# Patient Record
Sex: Male | Born: 1965 | Hispanic: Yes | Marital: Married | Smoking: Never smoker
Health system: Southern US, Community
[De-identification: ages and names within clinical notes are randomized; demographics above are authoritative.]

## PROBLEM LIST (undated history)

## (undated) DIAGNOSIS — I10 Essential (primary) hypertension: Secondary | ICD-10-CM

## (undated) DIAGNOSIS — F419 Anxiety disorder, unspecified: Secondary | ICD-10-CM

## (undated) DIAGNOSIS — K589 Irritable bowel syndrome without diarrhea: Secondary | ICD-10-CM

## (undated) DIAGNOSIS — E119 Type 2 diabetes mellitus without complications: Secondary | ICD-10-CM

## (undated) DIAGNOSIS — E785 Hyperlipidemia, unspecified: Secondary | ICD-10-CM

## (undated) DIAGNOSIS — H409 Unspecified glaucoma: Secondary | ICD-10-CM

## (undated) DIAGNOSIS — G4730 Sleep apnea, unspecified: Secondary | ICD-10-CM

## (undated) HISTORY — DX: Anxiety disorder, unspecified: F41.9

## (undated) HISTORY — DX: Hyperlipidemia, unspecified: E78.5

## (undated) HISTORY — PX: CHOLECYSTECTOMY: SHX55

## (undated) HISTORY — DX: Irritable bowel syndrome, unspecified: K58.9

## (undated) HISTORY — DX: Sleep apnea, unspecified: G47.30

## (undated) HISTORY — PX: APPENDECTOMY: SHX54

## (undated) HISTORY — DX: Unspecified glaucoma: H40.9

## (undated) HISTORY — DX: Type 2 diabetes mellitus without complications: E11.9

## (undated) HISTORY — DX: Essential (primary) hypertension: I10

---

## 2011-04-17 ENCOUNTER — Ambulatory Visit: Payer: Self-pay

## 2011-10-02 ENCOUNTER — Ambulatory Visit: Payer: Self-pay | Admitting: Gastroenterology

## 2011-10-27 ENCOUNTER — Ambulatory Visit: Payer: Self-pay | Admitting: Gastroenterology

## 2012-06-09 ENCOUNTER — Ambulatory Visit: Payer: Self-pay | Admitting: Internal Medicine

## 2013-01-27 ENCOUNTER — Ambulatory Visit: Payer: Self-pay

## 2014-10-22 ENCOUNTER — Ambulatory Visit: Payer: Self-pay | Admitting: Physician Assistant

## 2016-02-28 DIAGNOSIS — R079 Chest pain, unspecified: Secondary | ICD-10-CM | POA: Insufficient documentation

## 2017-09-08 ENCOUNTER — Other Ambulatory Visit: Payer: Self-pay

## 2017-09-08 MED ORDER — METFORMIN HCL 500 MG PO TABS: 500.0000 mg | ORAL_TABLET | Freq: Every day | ORAL | 3 refills | Status: DC

## 2017-12-21 ENCOUNTER — Other Ambulatory Visit: Payer: Self-pay | Admitting: Internal Medicine

## 2017-12-21 MED ORDER — METFORMIN HCL 500 MG PO TABS: 500.0000 mg | ORAL_TABLET | Freq: Every day | ORAL | 3 refills | Status: DC

## 2018-03-22 ENCOUNTER — Telehealth: Payer: Self-pay | Admitting: Internal Medicine

## 2018-03-22 ENCOUNTER — Other Ambulatory Visit: Payer: Self-pay | Admitting: Nurse Practitioner

## 2018-03-22 MED ORDER — METFORMIN HCL 500 MG PO TABS: 500.0000 mg | ORAL_TABLET | Freq: Every day | ORAL | 0 refills | Status: DC

## 2018-03-22 NOTE — Telephone Encounter (Signed)
Refilled medication for one month needs to have appt , left message

## 2018-04-01 ENCOUNTER — Ambulatory Visit: Payer: Self-pay | Admitting: Adult Health

## 2018-04-12 DIAGNOSIS — E119 Type 2 diabetes mellitus without complications: Secondary | ICD-10-CM | POA: Insufficient documentation

## 2018-06-16 ENCOUNTER — Telehealth: Payer: Self-pay

## 2018-06-16 NOTE — Telephone Encounter (Signed)
CALLED PT ADVISING TO REFILL MEDICATIONS PT NEEDED TO BE SEEN. SX APPT FOR 06-23-18

## 2018-06-23 ENCOUNTER — Ambulatory Visit: Payer: Self-pay | Admitting: Nurse Practitioner

## 2019-05-17 ENCOUNTER — Encounter: Payer: Self-pay | Admitting: Internal Medicine

## 2019-05-17 ENCOUNTER — Ambulatory Visit: Payer: BLUE CROSS/BLUE SHIELD | Admitting: Internal Medicine

## 2019-05-17 ENCOUNTER — Other Ambulatory Visit: Payer: Self-pay

## 2019-05-17 VITALS — BP 128/82 | HR 80 | Resp 16 | Ht 66.0 in | Wt 218.4 lb

## 2019-05-17 DIAGNOSIS — G4733 Obstructive sleep apnea (adult) (pediatric): Secondary | ICD-10-CM | POA: Diagnosis not present

## 2019-05-17 DIAGNOSIS — Z9989 Dependence on other enabling machines and devices: Secondary | ICD-10-CM

## 2019-05-17 DIAGNOSIS — E1159 Type 2 diabetes mellitus with other circulatory complications: Secondary | ICD-10-CM | POA: Insufficient documentation

## 2019-05-17 DIAGNOSIS — H409 Unspecified glaucoma: Secondary | ICD-10-CM | POA: Insufficient documentation

## 2019-05-17 DIAGNOSIS — I10 Essential (primary) hypertension: Secondary | ICD-10-CM | POA: Diagnosis not present

## 2019-05-17 DIAGNOSIS — K589 Irritable bowel syndrome without diarrhea: Secondary | ICD-10-CM | POA: Insufficient documentation

## 2019-05-17 DIAGNOSIS — G4730 Sleep apnea, unspecified: Secondary | ICD-10-CM | POA: Insufficient documentation

## 2019-05-17 DIAGNOSIS — F419 Anxiety disorder, unspecified: Secondary | ICD-10-CM | POA: Insufficient documentation

## 2019-05-17 NOTE — Progress Notes (Signed)
[facility] [address] [place], [place] [address]  Pulmonary Sleep Medicine   Office Visit Note  Patient Name: Andre Savage DOB: July 28, 1966 MRN [id number]  Date of Service: 05/17/2019  Complaints/HPI: PT is here after awhile, he has been using cpap for  Years.  He does  Not feel like his machine is working as well.  He reports feeling fatigued and tired.  His wife claims he is snoring at times even with his machine.  He reports feeling excessive daytime fatigue some afternoons.    ROS  General: (-) fever, (-) chills, (-) night sweats, (-) weakness Skin: (-) rashes, (-) itching,. Eyes: (-) visual changes, (-) redness, (-) itching. Nose and Sinuses: (-) nasal stuffiness or itchiness, (-) postnasal drip, (-) nosebleeds, (-) sinus trouble. Mouth and Throat: (-) sore throat, (-) hoarseness. Neck: (-) swollen glands, (-) enlarged thyroid, (-) neck pain. Respiratory: - cough, (-) bloody sputum, - shortness of breath, - wheezing. Cardiovascular: - ankle swelling, (-) chest pain. Lymphatic: (-) lymph node enlargement. Neurologic: (-) numbness, (-) tingling. Psychiatric: (-) anxiety, (-) depression   Current Medication: Outpatient Encounter Medications as of 05/17/2019  Medication Sig  . ALPRAZolam (XANAX) 0.25 MG tablet TAKE 1 TABLET BY MOUTH NIGHTLY AS NEEDED FOR SLEEP  . amLODipine (NORVASC) 5 MG tablet Take by mouth.  . colestipol (COLESTID) 1 g tablet Take by mouth.  . dorzolamide-timolol (COSOPT) 22.3-6.8 MG/ML ophthalmic solution Apply to eye.  . latanoprost (XALATAN) 0.005 % ophthalmic solution 1 drop at bedtime.  Marland Kitchen losartan-hydrochlorothiazide (HYZAAR) 100-12.5 MG tablet Take by mouth.  . metFORMIN (GLUCOPHAGE) 500 MG tablet Take 1 tablet (500 mg total) by mouth daily.  . Misc. Devices MISC by Does not apply route. cpap  . sildenafil (REVATIO) 20 MG tablet Take by mouth.   No facility-administered encounter medications on file as of 05/17/2019.     Surgical  History: Past Surgical History:  Procedure Laterality Date  . APPENDECTOMY    . CHOLECYSTECTOMY      Medical History: Past Medical History:  Diagnosis Date  . Anxiety   . Diabetes mellitus without complication (HCC)   . Glaucoma   . Hyperlipidemia   . Hypertension   . IBS (irritable bowel syndrome)   . Sleep apnea   . Sleep apnea     Family History: Family History  Problem Relation Age of Onset  . Hypertension Mother   . Cancer Maternal Grandfather     Social History: Social History   Socioeconomic History  . Marital status: Married    Spouse name: Not on file  . Number of children: Not on file  . Years of education: Not on file  . Highest education level: Not on file  Occupational History  . Not on file  Social Needs  . Financial resource strain: Not on file  . Food insecurity    Worry: Not on file    Inability: Not on file  . Transportation needs    Medical: Not on file    Non-medical: Not on file  Tobacco Use  . Smoking status: Never Smoker  . Smokeless tobacco: Never Used  Substance and Sexual Activity  . Alcohol use: Yes    Frequency: Never    Comment: social   . Drug use: Never  . Sexual activity: Not on file  Lifestyle  . Physical activity    Days per week: Not on file    Minutes per session: Not on file  . Stress: Not on file  Relationships  . Social Herbalist on phone: Not on file    Gets together: Not on file    Attends religious service: Not on file    Active member of club or organization: Not on file    Attends meetings of clubs or organizations: Not on file    Relationship status: Not on file  . Intimate partner violence    Fear of current or ex partner: Not on file    Emotionally abused: Not on file    Physically abused: Not on file    Forced sexual activity: Not on file  Other Topics Concern  . Not on file  Social History Narrative  . Not on file    Vital Signs: Blood pressure 128/82, pulse 80, resp. rate 16,  height 5\' 6"  (1.676 m), weight 218 lb 6.4 oz (99.1 kg), SpO2 97 %.  Examination: General Appearance: The patient is well-developed, well-nourished, and in no distress. Skin: Gross inspection of skin unremarkable. Head: normocephalic, no gross deformities. Eyes: no gross deformities noted. ENT: ears appear grossly normal no exudates. Neck: Supple. No thyromegaly. No LAD. Respiratory: clear bilaterally. Cardiovascular: Normal S1 and S2 without murmur or rub. Extremities: No cyanosis. pulses are equal. Neurologic: Alert and oriented. No involuntary movements.  LABS: No results found for this or any previous visit (from the past 2160 hour(s)).  Radiology: Dg Abd 1 View  Result Date: 10/22/2014 * PRIOR REPORT IMPORTED FROM AN EXTERNAL SYSTEM * CLINICAL DATA:  One week of right flank pain ; family history of kidney stones. EXAM: ABDOMEN - 1 VIEW COMPARISON:  None. FINDINGS: The bowel gas pattern is normal. There are no abnormal calcifications which project over the kidneys nor along the expected course of the ureters, or over the urinary bladder. The bony structures are unremarkable. There are surgical clips in the gallbladder fossa. IMPRESSION: No radiopaque urinary tract stones are demonstrated. Electronically Signed   By: David  Martinique   On: 10/22/2014 14:56     No results found.  No results found.    Assessment and Plan: Patient Active Problem List   Diagnosis Date Noted  . Anxiety 05/17/2019  . Glaucoma 05/17/2019  . Hypertension 05/17/2019  . IBS (irritable bowel syndrome) 05/17/2019  . Sleep apnea 05/17/2019  . Controlled type 2 diabetes mellitus without complication, without long-term current use of insulin ([place]) 04/12/2018  . Chest pain with low risk for cardiac etiology 02/28/2016   1. OSA on CPAP Sleep study ordered to await patient for new CPAP machine.  Patient will likely need titration study to determine appropriate pressure when that time comes. - [facility] SLEEP STUDY;  Future  2. Essential hypertension Stable, continue present therapy.   3. Morbid obesity ([facility]) Obesity Counseling: Risk Assessment: An assessment of behavioral risk factors was made today and includes lack of exercise sedentary lifestyle, lack of portion control and poor dietary habits.  Risk Modification Advice: She was counseled on portion control guidelines. Restricting daily caloric intake to. . The detrimental long term effects of obesity on her health and ongoing poor compliance was also discussed with the patient.     General Counseling: I have discussed the findings of the evaluation and examination with [facility].  I have also discussed any further diagnostic evaluation thatmay be needed or ordered today. Joseangel verbalizes understanding of the findings of todays visit. We also reviewed his medications today and discussed drug interactions and side effects including but not limited excessive drowsiness and altered mental states.  We also discussed that there is always a risk not just to him but also people around him. he has been encouraged to call the office with any questions or concerns that should arise related to todays visit.    Time spent: 15  I have personally obtained a history, examined the patient, evaluated laboratory and imaging results, formulated the assessment and plan and placed orders.    Yevonne PaxSaadat A Khan, MD [facility] Pulmonary and Critical Care Sleep medicine

## 2019-05-29 ENCOUNTER — Other Ambulatory Visit: Payer: BLUE CROSS/BLUE SHIELD | Admitting: Internal Medicine

## 2019-05-30 ENCOUNTER — Other Ambulatory Visit: Payer: BLUE CROSS/BLUE SHIELD | Admitting: Internal Medicine

## 2019-06-12 ENCOUNTER — Ambulatory Visit: Payer: BLUE CROSS/BLUE SHIELD | Admitting: Internal Medicine

## 2019-07-14 DIAGNOSIS — M4317 Spondylolisthesis, lumbosacral region: Secondary | ICD-10-CM | POA: Insufficient documentation

## 2019-07-14 DIAGNOSIS — M51379 Other intervertebral disc degeneration, lumbosacral region without mention of lumbar back pain or lower extremity pain: Secondary | ICD-10-CM | POA: Insufficient documentation

## 2019-09-14 ENCOUNTER — Other Ambulatory Visit: Payer: Self-pay | Admitting: Ophthalmology

## 2019-09-14 ENCOUNTER — Other Ambulatory Visit: Payer: Self-pay | Admitting: Internal Medicine

## 2019-09-14 DIAGNOSIS — H5347 Heteronymous bilateral field defects: Secondary | ICD-10-CM

## 2019-09-20 ENCOUNTER — Ambulatory Visit
Admission: RE | Admit: 2019-09-20 | Discharge: 2019-09-20 | Disposition: A | Discharge status: Home / Self Care | Payer: BLUE CROSS/BLUE SHIELD | Source: Ambulatory Visit | Attending: Ophthalmology | Admitting: Ophthalmology

## 2019-09-20 ENCOUNTER — Other Ambulatory Visit: Payer: Self-pay

## 2019-09-20 DIAGNOSIS — H5347 Heteronymous bilateral field defects: Secondary | ICD-10-CM | POA: Insufficient documentation

## 2019-09-20 LAB — POCT I-STAT CREATININE: Creatinine, Ser: 1.1 mg/dL (ref 0.61–1.24)

## 2019-09-20 MED ORDER — IOHEXOL 300 MG/ML  SOLN
75.0000 mL | Freq: Once | INTRAMUSCULAR | Status: AC | PRN
  Administered 2019-09-20: 75 mL via INTRAVENOUS

## 2019-11-01 DIAGNOSIS — D352 Benign neoplasm of pituitary gland: Secondary | ICD-10-CM | POA: Insufficient documentation

## 2019-12-02 ENCOUNTER — Ambulatory Visit: Payer: BLUE CROSS/BLUE SHIELD | Attending: Internal Medicine

## 2019-12-02 DIAGNOSIS — Z23 Encounter for immunization: Secondary | ICD-10-CM

## 2019-12-02 NOTE — Progress Notes (Signed)
   Covid-19 Vaccination Clinic  Name:  Andre Savage    MRN: [id number] DOB: April 11, 1966  12/02/2019  Mr. Andre Savage was observed post Covid-19 immunization for 15 minutes without incident. He was provided with Vaccine Information Sheet and instruction to access the V-Safe system.   Mr. Andre Savage was instructed to call 911 with any severe reactions post vaccine: Marland Kitchen Difficulty breathing  . Swelling of face and throat  . A fast heartbeat  . A bad rash all over body  . Dizziness and weakness   Immunizations Administered    Name Date Dose VIS Date Route   Pfizer COVID-19 Vaccine 12/02/2019  3:48 PM 0.3 mL 08/18/2019 Intramuscular   Manufacturer: [employer], [employer]   Lot: [id number]   NDC: [address]-0

## 2019-12-27 ENCOUNTER — Ambulatory Visit: Payer: BLUE CROSS/BLUE SHIELD | Attending: Internal Medicine

## 2019-12-27 DIAGNOSIS — Z23 Encounter for immunization: Secondary | ICD-10-CM

## 2019-12-27 NOTE — Progress Notes (Signed)
   Covid-19 Vaccination Clinic  Name:  Andre Savage    MRN: [id number] DOB: [birth year]  12/27/2019  Mr. Andre Savage was observed post Covid-19 immunization for 15 minutes without incident. He was provided with Vaccine Information Sheet and instruction to access the V-Safe system.   Mr. Andre Savage was instructed to call 911 with any severe reactions post vaccine: Marland Kitchen Difficulty breathing  . Swelling of face and throat  . A fast heartbeat  . A bad rash all over body  . Dizziness and weakness   Immunizations Administered    Name Date Dose VIS Date Route   Pfizer COVID-19 Vaccine 12/27/2019  4:06 PM 0.3 mL 11/01/2018 Intramuscular   Manufacturer: [employer], [employer]   Lot: [id number]   NDC: [address]-6

## 2020-04-13 ENCOUNTER — Emergency Department
Admission: EM | Admit: 2020-04-13 | Discharge: 2020-04-13 | Disposition: A | Discharge status: Home / Self Care | Payer: BLUE CROSS/BLUE SHIELD | Attending: Emergency Medicine | Admitting: Emergency Medicine

## 2020-04-13 ENCOUNTER — Other Ambulatory Visit: Payer: Self-pay

## 2020-04-13 DIAGNOSIS — R1013 Epigastric pain: Secondary | ICD-10-CM | POA: Diagnosis not present

## 2020-04-13 DIAGNOSIS — R1110 Vomiting, unspecified: Secondary | ICD-10-CM | POA: Insufficient documentation

## 2020-04-13 DIAGNOSIS — R079 Chest pain, unspecified: Secondary | ICD-10-CM | POA: Insufficient documentation

## 2020-04-13 DIAGNOSIS — Z5321 Procedure and treatment not carried out due to patient leaving prior to being seen by health care provider: Secondary | ICD-10-CM | POA: Diagnosis not present

## 2020-04-13 LAB — COMPREHENSIVE METABOLIC PANEL
ALT: 25 U/L (ref 0–44)
AST: 24 U/L (ref 15–41)
Albumin: 4.5 g/dL (ref 3.5–5.0)
Alkaline Phosphatase: 111 U/L (ref 38–126)
Anion gap: 12 (ref 5–15)
BUN: 15 mg/dL (ref 6–20)
CO2: 23 mmol/L (ref 22–32)
Calcium: 9.1 mg/dL (ref 8.9–10.3)
Chloride: 100 mmol/L (ref 98–111)
Creatinine, Ser: 0.77 mg/dL (ref 0.61–1.24)
GFR calc Af Amer: >60 mL/min (ref >60)
GFR calc non Af Amer: >60 mL/min (ref >60)
Glucose, Bld: 161 mg/dL — ABNORMAL HIGH (ref 70–99)
Potassium: 4.1 mmol/L (ref 3.5–5.1)
Sodium: 135 mmol/L (ref 135–145)
Total Bilirubin: 0.9 mg/dL (ref 0.3–1.2)
Total Protein: 7.6 g/dL (ref 6.5–8.1)

## 2020-04-13 LAB — URINALYSIS, COMPLETE (UACMP) WITH MICROSCOPIC
Bilirubin Urine: NEGATIVE
Glucose, UA: NEGATIVE mg/dL
Hgb urine dipstick: NEGATIVE
Ketones, ur: NEGATIVE mg/dL
Leukocytes,Ua: NEGATIVE
Nitrite: NEGATIVE
Protein, ur: 100 mg/dL — AB
Specific Gravity, Urine: 1.025 (ref 1.005–1.030)
Squamous Epithelial / HPF: NONE SEEN (ref 0–5)
pH: 5 (ref 5.0–8.0)

## 2020-04-13 LAB — LIPASE, BLOOD: Lipase: 34 U/L (ref 11–51)

## 2020-04-13 LAB — CBC
HCT: 41.1 % (ref 39.0–52.0)
Hemoglobin: 13.3 g/dL (ref 13.0–17.0)
MCH: 25.7 pg — ABNORMAL LOW (ref 26.0–34.0)
MCHC: 32.4 g/dL (ref 30.0–36.0)
MCV: 79.5 fL — ABNORMAL LOW (ref 80.0–100.0)
Platelets: 248 10*3/uL (ref 150–400)
RBC: 5.17 MIL/uL (ref 4.22–5.81)
RDW: 18.3 % — ABNORMAL HIGH (ref 11.5–15.5)
WBC: 13.2 10*3/uL — ABNORMAL HIGH (ref 4.0–10.5)
nRBC: 0 % (ref 0.0–0.2)

## 2020-04-13 NOTE — ED Notes (Signed)
Pt called for room, pt no answer x 2.

## 2020-04-13 NOTE — ED Notes (Signed)
Repeat VS obtained by this RN at this time. Pt visualized resting on bench at this time with eyes closed.

## 2020-04-13 NOTE — ED Notes (Signed)
Pt called for room, pt with no answer x 3.

## 2020-04-13 NOTE — ED Triage Notes (Signed)
Pt states at 2am he started having epigastric/chest pain with N&V. States fatigue and SOB as well. States feels better since vomiting. A&O. No distress noted.

## 2020-04-13 NOTE — ED Triage Notes (Signed)
First RN Note: pt presents to ED via POV with c/o epigastric pain that radiates up into his chest, per EMS pt relieved with emesis, vomited approx 500cc's with EMS. Upon EMS arrival pain 8/10, now 3/10 after emesis.   125/60

## 2020-04-13 NOTE — ED Notes (Signed)
Pt called for repeat VS x 1, pt not visualized in ED.

## 2021-02-20 DIAGNOSIS — E230 Hypopituitarism: Secondary | ICD-10-CM | POA: Insufficient documentation

## 2021-10-23 DIAGNOSIS — D352 Benign neoplasm of pituitary gland: Secondary | ICD-10-CM | POA: Diagnosis not present

## 2021-10-23 DIAGNOSIS — R69 Illness, unspecified: Secondary | ICD-10-CM | POA: Diagnosis not present

## 2021-10-23 DIAGNOSIS — I10 Essential (primary) hypertension: Secondary | ICD-10-CM | POA: Diagnosis not present

## 2021-10-23 DIAGNOSIS — E119 Type 2 diabetes mellitus without complications: Secondary | ICD-10-CM | POA: Diagnosis not present

## 2021-11-13 DIAGNOSIS — H401132 Primary open-angle glaucoma, bilateral, moderate stage: Secondary | ICD-10-CM | POA: Diagnosis not present

## 2021-12-16 DIAGNOSIS — E669 Obesity, unspecified: Secondary | ICD-10-CM | POA: Diagnosis not present

## 2021-12-16 DIAGNOSIS — E119 Type 2 diabetes mellitus without complications: Secondary | ICD-10-CM | POA: Diagnosis not present

## 2021-12-16 DIAGNOSIS — D352 Benign neoplasm of pituitary gland: Secondary | ICD-10-CM | POA: Diagnosis not present

## 2022-01-10 ENCOUNTER — Other Ambulatory Visit: Payer: Self-pay

## 2022-01-10 ENCOUNTER — Emergency Department
Admission: EM | Admit: 2022-01-10 | Discharge: 2022-01-10 | Disposition: A | Discharge status: Home / Self Care | Payer: 59 | Attending: Emergency Medicine | Admitting: Emergency Medicine

## 2022-01-10 ENCOUNTER — Emergency Department: Payer: 59

## 2022-01-10 DIAGNOSIS — N200 Calculus of kidney: Secondary | ICD-10-CM

## 2022-01-10 DIAGNOSIS — N132 Hydronephrosis with renal and ureteral calculous obstruction: Secondary | ICD-10-CM | POA: Diagnosis not present

## 2022-01-10 DIAGNOSIS — E119 Type 2 diabetes mellitus without complications: Secondary | ICD-10-CM | POA: Diagnosis not present

## 2022-01-10 DIAGNOSIS — I10 Essential (primary) hypertension: Secondary | ICD-10-CM | POA: Insufficient documentation

## 2022-01-10 DIAGNOSIS — M4317 Spondylolisthesis, lumbosacral region: Secondary | ICD-10-CM | POA: Diagnosis not present

## 2022-01-10 DIAGNOSIS — Z9049 Acquired absence of other specified parts of digestive tract: Secondary | ICD-10-CM | POA: Diagnosis not present

## 2022-01-10 DIAGNOSIS — I700 Atherosclerosis of aorta: Secondary | ICD-10-CM | POA: Diagnosis not present

## 2022-01-10 DIAGNOSIS — R1031 Right lower quadrant pain: Secondary | ICD-10-CM | POA: Diagnosis not present

## 2022-01-10 LAB — URINALYSIS, ROUTINE W REFLEX MICROSCOPIC
Bilirubin Urine: NEGATIVE
Glucose, UA: NEGATIVE mg/dL
Ketones, ur: NEGATIVE mg/dL
Leukocytes,Ua: NEGATIVE
Nitrite: NEGATIVE
Protein, ur: NEGATIVE mg/dL
RBC / HPF: >50 RBC/hpf — ABNORMAL HIGH (ref 0–5)
Specific Gravity, Urine: 1.008 (ref 1.005–1.030)
Squamous Epithelial / HPF: NONE SEEN (ref 0–5)
pH: 7 (ref 5.0–8.0)

## 2022-01-10 LAB — BASIC METABOLIC PANEL
Anion gap: 9 (ref 5–15)
BUN: 13 mg/dL (ref 6–20)
CO2: 29 mmol/L (ref 22–32)
Calcium: 9.4 mg/dL (ref 8.9–10.3)
Chloride: 98 mmol/L (ref 98–111)
Creatinine, Ser: 0.89 mg/dL (ref 0.61–1.24)
GFR, Estimated: >60 mL/min (ref >60)
Glucose, Bld: 175 mg/dL — ABNORMAL HIGH (ref 70–99)
Potassium: 3.9 mmol/L (ref 3.5–5.1)
Sodium: 136 mmol/L (ref 135–145)

## 2022-01-10 LAB — CBC
HCT: 43.6 % (ref 39.0–52.0)
Hemoglobin: 14.2 g/dL (ref 13.0–17.0)
MCH: 28.3 pg (ref 26.0–34.0)
MCHC: 32.6 g/dL (ref 30.0–36.0)
MCV: 87 fL (ref 80.0–100.0)
Platelets: 243 10*3/uL (ref 150–400)
RBC: 5.01 MIL/uL (ref 4.22–5.81)
RDW: 14.4 % (ref 11.5–15.5)
WBC: 7.2 10*3/uL (ref 4.0–10.5)
nRBC: 0 % (ref 0.0–0.2)

## 2022-01-10 MED ORDER — MORPHINE SULFATE (PF) 4 MG/ML IV SOLN
INTRAVENOUS | Status: AC
  Filled 2022-01-10: qty 1

## 2022-01-10 MED ORDER — TAMSULOSIN HCL 0.4 MG PO CAPS
0.4000 mg | ORAL_CAPSULE | Freq: Every day | ORAL | 0 refills | Status: DC
Start: 2022-01-10 — End: 2022-01-15

## 2022-01-10 MED ORDER — KETOROLAC TROMETHAMINE 30 MG/ML IJ SOLN
30.0000 mg | Freq: Once | INTRAMUSCULAR | Status: AC
  Administered 2022-01-10: 30 mg via INTRAVENOUS

## 2022-01-10 MED ORDER — ONDANSETRON HCL 4 MG/2ML IJ SOLN
INTRAMUSCULAR | Status: AC
  Filled 2022-01-10: qty 2

## 2022-01-10 MED ORDER — MORPHINE SULFATE (PF) 4 MG/ML IV SOLN
4.0000 mg | Freq: Once | INTRAVENOUS | Status: AC
  Administered 2022-01-10: 4 mg via INTRAVENOUS

## 2022-01-10 MED ORDER — OXYCODONE-ACETAMINOPHEN 5-325 MG PO TABS
1.0000 | ORAL_TABLET | ORAL | 0 refills | Status: AC | PRN
Start: 2022-01-10 — End: 2023-01-10

## 2022-01-10 MED ORDER — CEPHALEXIN 500 MG PO CAPS: 500.0000 mg | ORAL_CAPSULE | Freq: Two times a day (BID) | ORAL | 0 refills | Status: DC

## 2022-01-10 MED ORDER — KETOROLAC TROMETHAMINE 30 MG/ML IJ SOLN
INTRAMUSCULAR | Status: AC
  Filled 2022-01-10: qty 1

## 2022-01-10 MED ORDER — ONDANSETRON HCL 4 MG/2ML IJ SOLN
4.0000 mg | Freq: Once | INTRAMUSCULAR | Status: AC
  Administered 2022-01-10: 4 mg via INTRAVENOUS

## 2022-01-10 MED ORDER — ONDANSETRON 4 MG PO TBDP: 4.0000 mg | ORAL_TABLET | Freq: Three times a day (TID) | ORAL | 0 refills | Status: DC | PRN

## 2022-01-10 MED ORDER — SODIUM CHLORIDE 0.9 % IV BOLUS
500.0000 mL | Freq: Once | INTRAVENOUS | Status: AC
Start: 2022-01-10 — End: 2022-01-10
  Administered 2022-01-10: 500 mL via INTRAVENOUS

## 2022-01-10 MED ORDER — NAPROXEN 500 MG PO TABS: 500.0000 mg | ORAL_TABLET | Freq: Two times a day (BID) | ORAL | 2 refills | Status: DC

## 2022-01-10 NOTE — ED Notes (Signed)
Pt verbalizes understanding of d/c instructions and when to return to ED. Steady gait, NAD noted.  ?

## 2022-01-10 NOTE — ED Notes (Signed)
See triage note. Pt reports waking up a few hours ago with right sided flank pain and nausea. Denies hematuria or dysuria. Appears uncomfortable.  ?

## 2022-01-10 NOTE — ED Provider Notes (Signed)
? ?[facility] ?Provider Note ? ? ? Event Date/Time  ? First MD Initiated Contact with Patient 01/10/22 0730   ?  (approximate) ? ? ?History  ? ?Flank Pain and Dysuria ? ? ?HPI ? ?Andre Savage is a 56 y.o. male with a history of diabetes, hypertension, appendectomy, cholecystectomy who presents with right-sided flank pain with radiation to his right groin.  He denies hematuria.  He reports the pain started at 4 AM this morning.  Felt well yesterday.  No history of kidney stones.  Denies dysuria to me.  No fevers.  1 episode of vomiting ?  ? ? ?Physical Exam  ? ?Triage Vital Signs: ?ED Triage Vitals  ?Enc Vitals Group  ?   BP 01/10/22 0614 (!) 158/91  ?   Pulse Rate 01/10/22 0614 66  ?   Resp 01/10/22 0614 17  ?   Temp 01/10/22 0614 98 ?F (36.7 ?C)  ?   Temp Source 01/10/22 0614 Oral  ?   SpO2 01/10/22 0614 97 %  ?   Weight --   ?   Height --   ?   Head Circumference --   ?   Peak Flow --   ?   Pain Score 01/10/22 0612 10  ?   Pain Loc --   ?   Pain Edu? --   ?   Excl. in GC? --   ? ? ?Most recent vital signs: ?Vitals:  ? 01/10/22 0844 01/10/22 0845  ?BP:    ?Pulse:  61  ?Resp:    ?Temp: 98.2 ?F (36.8 ?C)   ?SpO2:  96%  ? ? ? ?General: Awake, no distress.  ?CV:  Good peripheral perfusion.  ?Resp:  Normal effort.  ?Abd:  No distention.  Soft, nontender ?Other:  Mild right CVA tenderness ? ? ?ED Results / Procedures / Treatments  ? ?Labs ?(all labs ordered are listed, but only abnormal results are displayed) ?Labs Reviewed  ?URINALYSIS, ROUTINE W REFLEX MICROSCOPIC - Abnormal; Notable for the following components:  ?    Result Value  ? Color, Urine COLORLESS (*)   ? APPearance CLEAR (*)   ? Hgb urine dipstick MODERATE (*)   ? RBC / HPF >50 (*)   ? Bacteria, UA RARE (*)   ? All other components within normal limits  ?BASIC METABOLIC PANEL - Abnormal; Notable for the following components:  ? Glucose, Bld 175 (*)   ? All other components within normal limits  ?URINE CULTURE  ?CBC   ? ? ? ?EKG ? ? ? ? ?RADIOLOGY ?CT renal stone study interpreted by me, 5 mm stone noted ? ? ? ?PROCEDURES: ? ?Critical Care performed:  ? ?Procedures ? ? ?MEDICATIONS ORDERED IN ED: ?Medications  ?ketorolac (TORADOL) 30 MG/ML injection 30 mg ( Intravenous Not Given 01/10/22 0818)  ?ondansetron [facility]) injection 4 mg ( Intravenous Not Given 01/10/22 0819)  ?sodium chloride 0.9 % bolus 500 mL (0 mLs Intravenous Stopped 01/10/22 0834)  ?morphine (PF) 4 MG/ML injection 4 mg ( Intravenous Not Given 01/10/22 0819)  ? ? ? ?IMPRESSION / MDM / ASSESSMENT AND PLAN / ED COURSE  ?I reviewed the triage vital signs and the nursing notes. ? ? ? ?Patient presents with new acute abdominal pain that started at 4 AM this morning.  1 episode of vomiting. ? ?Differential includes ureterolithiasis, UTI, colitis ? ?We will treat the patient with IV Toradol, IV Zofran, IV fluids, obtain CT renal stone study ? ?Lab  work reviewed, normal CBCs, normal BMP, small amount of hemoglobin urinalysis ? ?CT scan demonstrates a 5 mm proximal ureteral stone, patient has never smoked. ? ?Pain relieved after IV morphine given ? ?Discussed with him the need for outpatient follow-up with urology.  Patient is a diabetic, no evidence of UTI however will cover with antibiotics, he knows to return immediately if any fevers. ? ? ? ? ? ?  ? ? ?FINAL CLINICAL IMPRESSION(S) / ED DIAGNOSES  ? ?Final diagnoses:  ?Kidney stone  ? ? ? ?Rx / DC Orders  ? ?ED Discharge Orders   ? ?      Ordered  ?  naproxen (NAPROSYN) 500 MG tablet  2 times daily with meals       ? 01/10/22 0836  ?  oxyCODONE-acetaminophen (PERCOCET) 5-325 MG tablet  Every 4 hours PRN       ? 01/10/22 0836  ?  tamsulosin (FLOMAX) 0.4 MG CAPS capsule  Daily       ? 01/10/22 0836  ?  ondansetron (ZOFRAN-ODT) 4 MG disintegrating tablet  Every 8 hours PRN       ? 01/10/22 0836  ?  cephALEXin (KEFLEX) 500 MG capsule  2 times daily       ? 01/10/22 0838  ? ?  ?  ? ?  ? ? ? ?Note:  This document was prepared using  Dragon voice recognition software and may include unintentional dictation errors. ?  ?Jene Every, MD ?01/10/22 [phone] ? ?

## 2022-01-10 NOTE — ED Notes (Signed)
Pt to CT

## 2022-01-10 NOTE — ED Notes (Signed)
Pt noted to have one episode of emesis.

## 2022-01-10 NOTE — ED Triage Notes (Signed)
Pt states he was awoken with right sided flank pain this morning around 3 hours ago. Endorses pain with urination. Pt is A&O times 4 and vitals WNL.  ?

## 2022-01-10 NOTE — Discharge Instructions (Addendum)
If you develop a fever you need to return to the emergency department immediately. ?

## 2022-01-11 LAB — URINE CULTURE: Culture: NO GROWTH

## 2022-01-13 ENCOUNTER — Encounter: Payer: Self-pay | Admitting: Urology

## 2022-01-13 ENCOUNTER — Ambulatory Visit: Payer: 59 | Admitting: Urology

## 2022-01-13 ENCOUNTER — Ambulatory Visit
Admission: RE | Admit: 2022-01-13 | Discharge: 2022-01-13 | Disposition: A | Discharge status: Home / Self Care | Payer: 59 | Source: Ambulatory Visit | Attending: Urology | Admitting: Urology

## 2022-01-13 ENCOUNTER — Other Ambulatory Visit: Payer: Self-pay | Admitting: *Deleted

## 2022-01-13 ENCOUNTER — Ambulatory Visit
Admission: RE | Admit: 2022-01-13 | Discharge: 2022-01-13 | Disposition: A | Discharge status: Home / Self Care | Payer: 59 | Attending: Urology | Admitting: Urology

## 2022-01-13 ENCOUNTER — Other Ambulatory Visit: Payer: Self-pay | Admitting: Urology

## 2022-01-13 VITALS — BP 122/78 | HR 73 | Ht 66.0 in | Wt 220.0 lb

## 2022-01-13 DIAGNOSIS — N201 Calculus of ureter: Secondary | ICD-10-CM | POA: Diagnosis not present

## 2022-01-13 DIAGNOSIS — N200 Calculus of kidney: Secondary | ICD-10-CM

## 2022-01-13 DIAGNOSIS — R109 Unspecified abdominal pain: Secondary | ICD-10-CM | POA: Diagnosis not present

## 2022-01-13 NOTE — Progress Notes (Signed)
? ?  01/13/22 ?4:24 PM  ? ?Andre Savage ?04-Jul-1966 ?[id number] ? ?CC: Right proximal ureteral stone ? ?HPI: ?56 year old male who presented to the ER on 01/10/2022 with severe right-sided flank pain.  Evaluation with CT showed a 5 mm right proximal ureteral stone, and he was discharged with medical expulsive therapy.  He has done well over the last 24 hours, but has had some intermittent right-sided flank pain over the last week ? ? ?PMH: ?Past Medical History:  ?Diagnosis Date  ? Anxiety   ? Diabetes mellitus without complication (HCC)   ? Glaucoma   ? Hyperlipidemia   ? Hypertension   ? IBS (irritable bowel syndrome)   ? Sleep apnea   ? Sleep apnea   ? ? ?Surgical History: ?Past Surgical History:  ?Procedure Laterality Date  ? APPENDECTOMY    ? CHOLECYSTECTOMY    ? ? ?Family History: ?Family History  ?Problem Relation Age of Onset  ? Hypertension Mother   ? Cancer Maternal Grandfather   ? ? ?Social History:  reports that he has never smoked. He has never been exposed to tobacco smoke. He has never used smokeless tobacco. He reports current alcohol use. He reports that he does not use drugs. ? ?Physical Exam: ?BP 122/78   Pulse 73   Ht 5\' 6"  (1.676 m)   Wt 220 lb (99.8 kg)   BMI 35.51 kg/m?   ? ?Constitutional:  Alert and oriented, No acute distress. ?Cardiovascular: No clubbing, cyanosis, or edema. ?Respiratory: Normal respiratory effort, no increased work of breathing. ?GI: Abdomen is soft, nontender, nondistended, no abdominal masses ? ? ?Laboratory Data: ?Reviewed in epic ? ?Pertinent Imaging: ?I have personally viewed and interpreted the CT showing a 5 mm right proximal ureteral stone, 900HU, 14cm SSD.  KUB today stone clearly seen in proximal ureter with no change in location. ? ?Assessment & Plan:   ?56 year old male with 5 mm right proximal ureteral stone, no progression over the last few days with ongoing right-sided flank pain, no clinical or laboratory evidence of infection. ? ?We discussed  various treatment options for urolithiasis including observation with or without medical expulsive therapy, shockwave lithotripsy (SWL), ureteroscopy and laser lithotripsy with stent placement, and percutaneous nephrolithotomy. ? ?We discussed that management is based on stone size, location, density, patient co-morbidities, and patient preference.  ? ?Stones <17mm in size have a >80% spontaneous passage rate. Data surrounding the use of tamsulosin for medical expulsive therapy is controversial, but meta analyses suggests it is most efficacious for distal stones between 5-47mm in size. Possible side effects include dizziness/lightheadedness, and retrograde ejaculation. ? ?SWL has a lower stone free rate in a single procedure, but also a lower complication rate compared to ureteroscopy and avoids a stent and associated stent related symptoms. Possible complications include renal hematoma, steinstrasse, and need for additional treatment. ? ?Ureteroscopy with laser lithotripsy and stent placement has a higher stone free rate than SWL in a single procedure, however increased complication rate including possible infection, ureteral injury, bleeding, and stent related morbidity. Common stent related symptoms include dysuria, urgency/frequency, and flank pain. ? ?After an extensive discussion of the risks and benefits of the above treatment options, the patient would like to proceed with right shockwave lithotripsy this week. ? ? ?9m, MD ?01/13/2022 ? ?[facility] ?[address], [address] ?[place], [place] ?[phone] ? ? ?

## 2022-01-13 NOTE — Patient Instructions (Signed)
Litotricia ?Lithotripsy ? ?La litotricia es un tratamiento que puede ayudar a romper los c?lculos renales que son demasiado grandes para eliminarse por s? mismos. Este es un procedimiento no quir?rgico que tritura los c?lculos renales con ondas de choque. Estas ondas de choque atraviesan el cuerpo y se concentran en los c?lculos renales. Estas hacen que los c?lculos renales se rompan en pedazos peque?os mientras a?n est?n en las v?as urinarias. Los pedazos m?s peque?os de los c?lculos pueden eliminarse con m?s facilidad del cuerpo a trav?s de la orina. ?Informe al m?dico acerca de lo siguiente: ?Cualquier alergia que tenga. ?Todos los [employer] [place], incluidos vitaminas, hierbas, gotas oft?lmicas, cremas y [address] de [address]. ?Problemas previos que usted o alg?n miembro de su familia hayan tenido con los anest?sicos. ?Cualquier trastorno de [employer]. ?Cirug?as a las que se haya sometido. ?Cualquier afecci?n m?dica que tenga. ?Si est? embarazada o podr?a estarlo. ??Cu?les son los riesgos? ?En general, se trata de un procedimiento seguro. Sin embargo, pueden ocurrir complicaciones, por ejemplo: ?Infecci?n. ?Sangrado del ri??n. ?Moretones en el ri??n o en la piel. ?Formaci?n de cicatrices en el ri??n que puede producir: ?Aumento de la presi?n arterial. ?Insuficiencia renal. ?Que los c?lculos renales reaparezcan (recurrencia). ?Da?o a otras estructuras u ?rganos, por ejemplo, el h?gado, el colon, el bazo o el p?ncreas. ?Bloqueo (obstrucci?n) del conducto que transporta la orina desde el ri??n a la vejiga (ur?ter). ?Imposibilidad de que el c?lculo renal se rompa en pedazos (fragmentos). ??Qu? ocurre antes del procedimiento? ?Mantenerse hidratado ?Siga las instrucciones del m?dico acerca de mantenerse hidratado, las cuales pueden incluir lo siguiente: ?[address] horas antes del procedimiento, puede beber l?quidos transparentes, como agua, jugos de fruta sin pulpa, caf? negro y t?  solo. ?Restricciones en las comidas y bebidas ?Siga las instrucciones del m?dico respecto de las restricciones de comidas o bebidas, las cuales pueden incluir lo siguiente: ?Ocho horas antes del procedimiento, deje de ingerir comidas o alimentos pesados, como carne, alimentos fritos o alimentos grasos. ?Seis horas antes del procedimiento, deje de ingerir comidas o alimentos livianos, como tostadas o cereales. ?Seis horas antes del procedimiento, deje de beber [place] o bebidas que [employer]. ?Dos horas antes del procedimiento, deje de beber l?quidos transparentes. ?Medicamentos ?Consulte al m?dico si debe hacer o no lo siguiente: ?Cambiar o suspender los medicamentos que [place] habitualmente. Esto es muy importante si toma medicamentos para la diabetes o anticoagulantes. ?Tomar medicamentos como aspirina e ibuprofeno. Estos medicamentos pueden tener un efecto anticoagulante en la [place]. No tome estos medicamentos a menos que el m?dico se lo indique. ?Usar medicamentos de venta libre, vitaminas, hierbas y suplementos. ?Estudios ?Pueden hacerle estudios, por ejemplo: ?An?lisis de sangre. ?An?lisis de [place]. ?Pruebas de diagn?stico por im?genes, como una exploraci?n por tomograf?a computarizada (TC). ?Instrucciones generales ?Haga que alguien lo lleve a su casa desde el hospital o la cl?nica. ?Si se ir? a su casa inmediatamente despu?s del procedimiento, p?dale a alguien que se quede con usted durante 24 horas. ?Preg?ntele al m?dico qu? medidas se tomar?n para ayudar a prevenir una infecci?n. Estas pueden incluir lavar la piel con un jab?n antis?ptico. ??Qu? ocurre durante el procedimiento? ? ?Le colocar?n una v?a intravenosa (i.v.) en una vena. ?Le administrar?n uno o m?s de los siguientes medicamentos: ?Un medicamento para ayudar a relajarse (sedante). ?Un medicamento que lo har? dormir (anestesia general). ?Le colocar?n una almohadilla rellena de agua debajo del ri??n o sobre el abdomen. En ciertos casos, es  posible que lo coloquen en una tina  de agua templada. ?Lo colocar?n en una posici?n que facilite llegar al c?lculo renal. ?Se le realizar? una radiograf?a o una ecograf?a para localizar el c?lculo. ?Las ondas de choque apuntar?n al c?lculo. Si permanece despierto, podr? sentir una sensaci?n de golpeteo a medida que las ondas de choque pasan a trav?s del cuerpo. ?Posiblemente se le coloque un tubo flexible ahuecado (stent) en el ur?ter. Esto permitir? que la orina siga fluyendo desde el ri??n en el caso de que los fragmentos del c?lculo hayan estado obstruyendo el ur?ter. ?El procedimiento puede variar seg?n el m?dico y el hospital. ??Qu? ocurre despu?s del procedimiento? ?Es posible que le hagan una radiograf?a para ver si el procedimiento pudo romper el c?lculo renal y cu?nto de este se ha eliminado. Si quedan fragmentos grandes del c?lculo despu?s del tratamiento, ser? necesario un segundo procedimiento en alg?n momento posterior. ?Le controlar?n la presi?n arterial, la frecuencia card?aca, la frecuencia respiratoria y Air cabin crew de ox?geno en la sangre hasta que le den el alta del hospital o la cl?nica. ?Pueden administrarle antibi?ticos o analg?sicos seg?n sea necesario. ?Si le colocaron un stent en el ur?ter durante la cirug?a, lo retirar?n despu?s de algunas semanas. ?Es posible que deba filtrar la orina para recoger los pedazos del c?lculo renal para ser [employer]. ?Deber? tomar [employer]. ?Si le administraron un sedante durante el procedimiento, puede afectarlo por varias horas. No conduzca ni opere maquinaria hasta que el m?dico le indique que es seguro [place]. ?Resumen ?La litotricia es un tratamiento que puede ayudar a romper los c?lculos renales que son demasiado grandes para eliminarse por s? mismos. ?La litotricia es un procedimiento no quir?rgico que destruye un c?lculo renal con ondas de choque. ?En general, se trata de un procedimiento seguro. Sin embargo, es posible que se presenten  complicaciones, incluidos da?os en el ri??n o en otros ?rganos, infecci?n u obstrucci?n del tubo que transporta la orina desde los ri?ones hasta la vejiga (ur?ter). ?Pueden colocarle un stent en el ur?ter para ayudar a drenar la orina. Este stent puede dejarse colocado durante algunas semanas. ?Luego del procedimiento deber? beber gran cantidad de agua. Tal vez le pidan que filtre la orina para recoger los fragmentos de c?lculos renales para ser analizados. ?Esta informaci?n no tiene Theme park manager el consejo del m?dico. Aseg?rese de hacerle al m?dico cualquier pregunta que tenga. ?Document Revised: 05/21/2021 Document Reviewed: 05/21/2021 ?Elsevier Patient Education ? 2023 Elsevier Inc. ? ?Litotricia, cuidados posteriores ?Lithotripsy, Care After ?Esta hoja le brinda informaci?n sobre c?mo cuidarse despu?s del procedimiento. El m?dico tambi?n podr? darle instrucciones m?s espec?ficas. Comun?quese con el m?dico si tiene problemas o preguntas. ??Qu? puedo esperar despu?s del procedimiento? ?Despu?s del procedimiento, es normal tener los siguientes s?ntomas: ?Un poco de [employer]. Esto no deber?a prolongarse m?s de unos d?as. ?Dolor en la espalda, en la parte superior y los lados del abdomen durante algunos d?as. ?[place] o moretones en la zona donde la onda de choque ingres? en la piel. ?Dolor, Dentist o n?useas cuando los trozos (fragmentos) del c?lculo renal se trasladan por el conducto que transporta la orina desde los ri?ones hasta la vejiga (ur?ter). Los fragmentos del c?lculo pueden comenzar a salir poco despu?s del procedimiento, pero pueden continuar saliendo tras [place] 4 a 8 semanas. ?Inf?rmele a su m?dico si siente dolor intenso o n?useas. Estos s?ntomas pueden ser causados por un c?lculo grande que no se rompi? y que requiere que usted deba Engineer, production. ?Sensaci?n de Brewing technologist al [employer]. ?Sensaci?n de dolor  o malestar en la parte baja del abdomen o en la base del pene (en  los hombres). ?Siga estas instrucciones en su casa: ?Medicamentos ?Use los medicamentos de venta libre y los recetados solamente como se lo haya indicado el m?dico. ?Si le recetaron un antibi?tico, t?melo como se lo haya indicado el m

## 2022-01-13 NOTE — H&P (View-Only) (Signed)
? ?  01/13/22 ?4:24 PM  ? ?Andre Savage ?08/17/1966 ?[id number] ? ?CC: Right proximal ureteral stone ? ?HPI: ?56-year-old male who presented to the ER on 01/10/2022 with severe right-sided flank pain.  Evaluation with CT showed a 5 mm right proximal ureteral stone, and he was discharged with medical expulsive therapy.  He has done well over the last 24 hours, but has had some intermittent right-sided flank pain over the last week ? ? ?PMH: ?Past Medical History:  ?Diagnosis Date  ? Anxiety   ? Diabetes mellitus without complication (HCC)   ? Glaucoma   ? Hyperlipidemia   ? Hypertension   ? IBS (irritable bowel syndrome)   ? Sleep apnea   ? Sleep apnea   ? ? ?Surgical History: ?Past Surgical History:  ?Procedure Laterality Date  ? APPENDECTOMY    ? CHOLECYSTECTOMY    ? ? ?Family History: ?Family History  ?Problem Relation Age of Onset  ? Hypertension Mother   ? Cancer Maternal Grandfather   ? ? ?Social History:  reports that he has never smoked. He has never been exposed to tobacco smoke. He has never used smokeless tobacco. He reports current alcohol use. He reports that he does not use drugs. ? ?Physical Exam: ?BP 122/78   Pulse 73   Ht 5' 6" (1.676 m)   Wt 220 lb (99.8 kg)   BMI 35.51 kg/m?   ? ?Constitutional:  Alert and oriented, No acute distress. ?Cardiovascular: No clubbing, cyanosis, or edema. ?Respiratory: Normal respiratory effort, no increased work of breathing. ?GI: Abdomen is soft, nontender, nondistended, no abdominal masses ? ? ?Laboratory Data: ?Reviewed in epic ? ?Pertinent Imaging: ?I have personally viewed and interpreted the CT showing a 5 mm right proximal ureteral stone, 900HU, 14cm SSD.  KUB today stone clearly seen in proximal ureter with no change in location. ? ?Assessment & Plan:   ?56-year-old male with 5 mm right proximal ureteral stone, no progression over the last few days with ongoing right-sided flank pain, no clinical or laboratory evidence of infection. ? ?We discussed  various treatment options for urolithiasis including observation with or without medical expulsive therapy, shockwave lithotripsy (SWL), ureteroscopy and laser lithotripsy with stent placement, and percutaneous nephrolithotomy. ? ?We discussed that management is based on stone size, location, density, patient co-morbidities, and patient preference.  ? ?Stones <5mm in size have a >80% spontaneous passage rate. Data surrounding the use of tamsulosin for medical expulsive therapy is controversial, but meta analyses suggests it is most efficacious for distal stones between 5-10mm in size. Possible side effects include dizziness/lightheadedness, and retrograde ejaculation. ? ?SWL has a lower stone free rate in a single procedure, but also a lower complication rate compared to ureteroscopy and avoids a stent and associated stent related symptoms. Possible complications include renal hematoma, steinstrasse, and need for additional treatment. ? ?Ureteroscopy with laser lithotripsy and stent placement has a higher stone free rate than SWL in a single procedure, however increased complication rate including possible infection, ureteral injury, bleeding, and stent related morbidity. Common stent related symptoms include dysuria, urgency/frequency, and flank pain. ? ?After an extensive discussion of the risks and benefits of the above treatment options, the patient would like to proceed with right shockwave lithotripsy this week. ? ? ?Lynette Noah, MD ?01/13/2022 ? ?[facility] ?[address], [address] ?[place], [place] [address] ?[phone] ? ? ?

## 2022-01-13 NOTE — Progress Notes (Signed)
ESWL ORDER FORM ? ?Expected date of procedure: 01/15/2022 ? ?Surgeon: John Giovanni, MD ? ?Post op standing: 2-4wk follow up w/KUB prior with PA ? ?Anticoagulation/Aspirin/NSAID standing order: Hold all 72 hours prior ? ?Anesthesia standing order: MAC ? ?VTE standing: SCD's ? ?Dx: Right Ureteral Stone ? ?Procedure: right Extracorporeal shock wave lithotripsy ? ?CPT : [phone] ? ?Standing Order Set:  ? ?*NPO after mn, KUB ? ?*NS 180m/hr, Keflex 5048mPO, Benadryl 2568mO, Valium 27m45m, Zofran 4mg 69m? ? ? ?Medications if other than standing orders:   NONE ? ? ?

## 2022-01-14 MED ORDER — DIAZEPAM 5 MG PO TABS: 10.0000 mg | ORAL_TABLET | ORAL | Status: AC

## 2022-01-14 MED ORDER — ONDANSETRON HCL 4 MG/2ML IJ SOLN: 4.0000 mg | Freq: Once | INTRAMUSCULAR | Status: AC

## 2022-01-14 MED ORDER — SODIUM CHLORIDE 0.9 % IV SOLN: INTRAVENOUS | Status: DC

## 2022-01-14 MED ORDER — DIPHENHYDRAMINE HCL 25 MG PO CAPS: 25.0000 mg | ORAL_CAPSULE | ORAL | Status: AC

## 2022-01-14 MED ORDER — CEPHALEXIN 500 MG PO CAPS: 500.0000 mg | ORAL_CAPSULE | Freq: Once | ORAL | Status: AC

## 2022-01-15 ENCOUNTER — Ambulatory Visit
Admission: RE | Admit: 2022-01-15 | Discharge: 2022-01-15 | Disposition: A | Discharge status: Home / Self Care | Payer: 59 | Attending: Urology | Admitting: Urology

## 2022-01-15 ENCOUNTER — Encounter: Payer: Self-pay | Admitting: Urology

## 2022-01-15 ENCOUNTER — Encounter
Admission: RE | Disposition: A | Discharge status: Home / Self Care | Payer: Self-pay | Source: Home / Self Care | Attending: Urology

## 2022-01-15 ENCOUNTER — Ambulatory Visit: Payer: 59

## 2022-01-15 ENCOUNTER — Other Ambulatory Visit: Payer: Self-pay

## 2022-01-15 ENCOUNTER — Telehealth: Payer: Self-pay

## 2022-01-15 DIAGNOSIS — G4730 Sleep apnea, unspecified: Secondary | ICD-10-CM | POA: Diagnosis not present

## 2022-01-15 DIAGNOSIS — Z01818 Encounter for other preprocedural examination: Secondary | ICD-10-CM | POA: Diagnosis not present

## 2022-01-15 DIAGNOSIS — I10 Essential (primary) hypertension: Secondary | ICD-10-CM | POA: Insufficient documentation

## 2022-01-15 DIAGNOSIS — N201 Calculus of ureter: Secondary | ICD-10-CM | POA: Insufficient documentation

## 2022-01-15 DIAGNOSIS — E669 Obesity, unspecified: Secondary | ICD-10-CM | POA: Diagnosis not present

## 2022-01-15 DIAGNOSIS — E119 Type 2 diabetes mellitus without complications: Secondary | ICD-10-CM | POA: Insufficient documentation

## 2022-01-15 DIAGNOSIS — Z9049 Acquired absence of other specified parts of digestive tract: Secondary | ICD-10-CM | POA: Diagnosis not present

## 2022-01-15 DIAGNOSIS — N200 Calculus of kidney: Secondary | ICD-10-CM | POA: Diagnosis not present

## 2022-01-15 HISTORY — PX: EXTRACORPOREAL SHOCK WAVE LITHOTRIPSY: SHX1557

## 2022-01-15 LAB — GLUCOSE, CAPILLARY: Glucose-Capillary: 119 mg/dL — ABNORMAL HIGH (ref 70–99)

## 2022-01-15 SURGERY — LITHOTRIPSY, ESWL
Anesthesia: Moderate Sedation | Laterality: Right

## 2022-01-15 MED ORDER — DIAZEPAM 5 MG PO TABS
ORAL_TABLET | ORAL | Status: AC
Start: 2022-01-15 — End: 2022-01-15
  Administered 2022-01-15: 10 mg via ORAL
  Filled 2022-01-15: qty 2

## 2022-01-15 MED ORDER — CEPHALEXIN 500 MG PO CAPS
ORAL_CAPSULE | ORAL | Status: AC
  Administered 2022-01-15: 500 mg via ORAL
  Filled 2022-01-15: qty 1

## 2022-01-15 MED ORDER — ONDANSETRON HCL 4 MG/2ML IJ SOLN
INTRAMUSCULAR | Status: AC
Start: 2022-01-15 — End: 2022-01-15
  Administered 2022-01-15: 4 mg via INTRAVENOUS
  Filled 2022-01-15: qty 2

## 2022-01-15 MED ORDER — DIPHENHYDRAMINE HCL 25 MG PO CAPS
ORAL_CAPSULE | ORAL | Status: AC
  Administered 2022-01-15: 25 mg via ORAL
  Filled 2022-01-15: qty 1

## 2022-01-15 MED ORDER — TAMSULOSIN HCL 0.4 MG PO CAPS: 0.4000 mg | ORAL_CAPSULE | Freq: Every day | ORAL | 0 refills | Status: DC

## 2022-01-15 NOTE — Discharge Instructions (Addendum)
As per the [facility] discharge instructions ?Refill tamsulosin sent to pharmacy which will help you pass stone fragments ?Continue oxycodone as needed.  Please contact the office if you need a refill ?Follow-up appointment scheduled 02/09/2022 ?Call [facility]rology [phone] fever greater than 101 degrees or pain not controlled with oral medication ? ?AMBULATORY SURGERY  ?DISCHARGE INSTRUCTIONS ? ? ?The drugs that you were given will stay in your system until tomorrow so for the next 24 hours you should not: ? ?Drive an automobile ?Make any legal decisions ?Drink any alcoholic beverage ? ? ?You may resume regular meals tomorrow.  Today it is better to start with liquids and gradually work up to solid foods. ? ?You may eat anything you prefer, but it is better to start with liquids, then soup and crackers, and gradually work up to solid foods. ? ? ?Please notify your doctor immediately if you have any unusual bleeding, trouble breathing, redness and pain at the surgery site, drainage, fever, or pain not relieved by medication. ? ? ? ?Additional Instructions: ? ? ?Please contact your physician with any problems or Same Day Surgery at [phone], Monday through Friday 6 am to 4 pm, or [place] at [facility] number at [phone]. ?

## 2022-01-15 NOTE — Interval H&P Note (Signed)
History and Physical Interval Note: ?CV:RRR ?Lungs:clear ? ?01/15/2022 ?8:55 AM ? ?[place]  has presented today for surgery, with the diagnosis of Right Ureteral Stone.  The various methods of treatment have been discussed with the patient and family. After consideration of risks, benefits and other options for treatment, the patient has consented to  Procedure(s): ?EXTRACORPOREAL SHOCK WAVE LITHOTRIPSY (ESWL) (Right) as a surgical intervention.  The patient's history has been reviewed, patient examined, no change in status, stable for surgery.  I have reviewed the patient's chart and labs.  Questions were answered to the patient's satisfaction.   ? ? ?Andre Savage C Andre Savage ? ? ?

## 2022-01-15 NOTE — Telephone Encounter (Signed)
Incoming call on triage line from pt's wife regarding pt's pain post lithotripsy today. Wife states pt is in extreme pain. She reports pt took oxycodone at noon with no relief. Per Carman Ching, pt may take 2 oxycodone at once, if still no pain control, pt will have to go to ER. Advised wife of this message, she expressed understanding.  ?

## 2022-02-09 ENCOUNTER — Ambulatory Visit: Payer: 59 | Admitting: Urology

## 2022-03-27 DIAGNOSIS — D352 Benign neoplasm of pituitary gland: Secondary | ICD-10-CM | POA: Diagnosis not present

## 2022-03-27 DIAGNOSIS — E230 Hypopituitarism: Secondary | ICD-10-CM | POA: Diagnosis not present

## 2022-03-27 DIAGNOSIS — E119 Type 2 diabetes mellitus without complications: Secondary | ICD-10-CM | POA: Diagnosis not present

## 2022-05-14 DIAGNOSIS — H401132 Primary open-angle glaucoma, bilateral, moderate stage: Secondary | ICD-10-CM | POA: Diagnosis not present

## 2022-05-21 DIAGNOSIS — H401132 Primary open-angle glaucoma, bilateral, moderate stage: Secondary | ICD-10-CM | POA: Diagnosis not present

## 2022-07-21 DIAGNOSIS — L811 Chloasma: Secondary | ICD-10-CM | POA: Diagnosis not present

## 2022-07-21 DIAGNOSIS — L570 Actinic keratosis: Secondary | ICD-10-CM | POA: Diagnosis not present

## 2022-07-24 DIAGNOSIS — E119 Type 2 diabetes mellitus without complications: Secondary | ICD-10-CM | POA: Diagnosis not present

## 2022-07-24 DIAGNOSIS — E230 Hypopituitarism: Secondary | ICD-10-CM | POA: Diagnosis not present

## 2022-07-24 DIAGNOSIS — D352 Benign neoplasm of pituitary gland: Secondary | ICD-10-CM | POA: Diagnosis not present

## 2022-07-24 DIAGNOSIS — Z5181 Encounter for therapeutic drug level monitoring: Secondary | ICD-10-CM | POA: Diagnosis not present

## 2022-07-27 DIAGNOSIS — Z125 Encounter for screening for malignant neoplasm of prostate: Secondary | ICD-10-CM | POA: Diagnosis not present

## 2022-07-27 DIAGNOSIS — D352 Benign neoplasm of pituitary gland: Secondary | ICD-10-CM | POA: Diagnosis not present

## 2022-07-27 DIAGNOSIS — E119 Type 2 diabetes mellitus without complications: Secondary | ICD-10-CM | POA: Diagnosis not present

## 2022-07-27 DIAGNOSIS — E230 Hypopituitarism: Secondary | ICD-10-CM | POA: Diagnosis not present

## 2022-08-11 DIAGNOSIS — G4733 Obstructive sleep apnea (adult) (pediatric): Secondary | ICD-10-CM | POA: Diagnosis not present

## 2022-08-11 DIAGNOSIS — D352 Benign neoplasm of pituitary gland: Secondary | ICD-10-CM | POA: Diagnosis not present

## 2022-08-11 DIAGNOSIS — I10 Essential (primary) hypertension: Secondary | ICD-10-CM | POA: Diagnosis not present

## 2022-08-11 DIAGNOSIS — Z1211 Encounter for screening for malignant neoplasm of colon: Secondary | ICD-10-CM | POA: Diagnosis not present

## 2022-08-11 DIAGNOSIS — E119 Type 2 diabetes mellitus without complications: Secondary | ICD-10-CM | POA: Diagnosis not present

## 2022-08-11 DIAGNOSIS — F419 Anxiety disorder, unspecified: Secondary | ICD-10-CM | POA: Diagnosis not present

## 2022-08-11 DIAGNOSIS — Z0001 Encounter for general adult medical examination with abnormal findings: Secondary | ICD-10-CM | POA: Diagnosis not present

## 2022-10-30 DIAGNOSIS — D352 Benign neoplasm of pituitary gland: Secondary | ICD-10-CM | POA: Diagnosis not present

## 2022-10-30 DIAGNOSIS — E230 Hypopituitarism: Secondary | ICD-10-CM | POA: Diagnosis not present

## 2022-10-30 DIAGNOSIS — E119 Type 2 diabetes mellitus without complications: Secondary | ICD-10-CM | POA: Diagnosis not present

## 2022-10-30 DIAGNOSIS — Z5181 Encounter for therapeutic drug level monitoring: Secondary | ICD-10-CM | POA: Diagnosis not present

## 2022-11-03 DIAGNOSIS — E230 Hypopituitarism: Secondary | ICD-10-CM | POA: Diagnosis not present

## 2022-11-26 DIAGNOSIS — H401132 Primary open-angle glaucoma, bilateral, moderate stage: Secondary | ICD-10-CM | POA: Diagnosis not present

## 2022-11-26 DIAGNOSIS — Z87898 Personal history of other specified conditions: Secondary | ICD-10-CM | POA: Diagnosis not present

## 2022-11-26 DIAGNOSIS — H2513 Age-related nuclear cataract, bilateral: Secondary | ICD-10-CM | POA: Diagnosis not present

## 2023-01-08 DIAGNOSIS — K219 Gastro-esophageal reflux disease without esophagitis: Secondary | ICD-10-CM | POA: Diagnosis not present

## 2023-01-08 DIAGNOSIS — Z8719 Personal history of other diseases of the digestive system: Secondary | ICD-10-CM | POA: Diagnosis not present

## 2023-01-08 DIAGNOSIS — Z1211 Encounter for screening for malignant neoplasm of colon: Secondary | ICD-10-CM | POA: Diagnosis not present

## 2023-01-08 DIAGNOSIS — Z9049 Acquired absence of other specified parts of digestive tract: Secondary | ICD-10-CM | POA: Diagnosis not present

## 2023-01-18 DIAGNOSIS — I10 Essential (primary) hypertension: Secondary | ICD-10-CM | POA: Diagnosis not present

## 2023-01-18 DIAGNOSIS — G4733 Obstructive sleep apnea (adult) (pediatric): Secondary | ICD-10-CM | POA: Diagnosis not present

## 2023-01-18 DIAGNOSIS — E119 Type 2 diabetes mellitus without complications: Secondary | ICD-10-CM | POA: Diagnosis not present

## 2023-02-12 DIAGNOSIS — G4733 Obstructive sleep apnea (adult) (pediatric): Secondary | ICD-10-CM | POA: Diagnosis not present

## 2023-02-12 DIAGNOSIS — E119 Type 2 diabetes mellitus without complications: Secondary | ICD-10-CM | POA: Diagnosis not present

## 2023-02-12 DIAGNOSIS — I10 Essential (primary) hypertension: Secondary | ICD-10-CM | POA: Diagnosis not present

## 2023-02-12 DIAGNOSIS — F419 Anxiety disorder, unspecified: Secondary | ICD-10-CM | POA: Diagnosis not present

## 2023-02-12 DIAGNOSIS — D352 Benign neoplasm of pituitary gland: Secondary | ICD-10-CM | POA: Diagnosis not present

## 2023-02-12 DIAGNOSIS — E669 Obesity, unspecified: Secondary | ICD-10-CM | POA: Diagnosis not present

## 2023-02-12 DIAGNOSIS — Z125 Encounter for screening for malignant neoplasm of prostate: Secondary | ICD-10-CM | POA: Diagnosis not present

## 2023-02-19 DIAGNOSIS — E230 Hypopituitarism: Secondary | ICD-10-CM | POA: Diagnosis not present

## 2023-02-19 DIAGNOSIS — Z5181 Encounter for therapeutic drug level monitoring: Secondary | ICD-10-CM | POA: Diagnosis not present

## 2023-02-19 DIAGNOSIS — D352 Benign neoplasm of pituitary gland: Secondary | ICD-10-CM | POA: Diagnosis not present

## 2023-02-19 DIAGNOSIS — E119 Type 2 diabetes mellitus without complications: Secondary | ICD-10-CM | POA: Diagnosis not present

## 2023-02-23 DIAGNOSIS — Z87898 Personal history of other specified conditions: Secondary | ICD-10-CM | POA: Diagnosis not present

## 2023-02-23 DIAGNOSIS — J310 Chronic rhinitis: Secondary | ICD-10-CM | POA: Diagnosis not present

## 2023-02-26 DIAGNOSIS — E230 Hypopituitarism: Secondary | ICD-10-CM | POA: Diagnosis not present

## 2023-03-08 DIAGNOSIS — M5137 Other intervertebral disc degeneration, lumbosacral region: Secondary | ICD-10-CM | POA: Diagnosis not present

## 2023-03-08 DIAGNOSIS — E669 Obesity, unspecified: Secondary | ICD-10-CM | POA: Diagnosis not present

## 2023-03-08 DIAGNOSIS — M5440 Lumbago with sciatica, unspecified side: Secondary | ICD-10-CM | POA: Diagnosis not present

## 2023-03-08 DIAGNOSIS — M4317 Spondylolisthesis, lumbosacral region: Secondary | ICD-10-CM | POA: Diagnosis not present

## 2023-03-08 DIAGNOSIS — M5441 Lumbago with sciatica, right side: Secondary | ICD-10-CM | POA: Diagnosis not present

## 2023-03-08 DIAGNOSIS — M5442 Lumbago with sciatica, left side: Secondary | ICD-10-CM | POA: Diagnosis not present

## 2023-03-08 DIAGNOSIS — G8929 Other chronic pain: Secondary | ICD-10-CM | POA: Diagnosis not present

## 2023-03-08 DIAGNOSIS — E119 Type 2 diabetes mellitus without complications: Secondary | ICD-10-CM | POA: Diagnosis not present

## 2023-03-10 ENCOUNTER — Other Ambulatory Visit: Payer: Self-pay | Admitting: Orthopedic Surgery

## 2023-03-10 DIAGNOSIS — G8929 Other chronic pain: Secondary | ICD-10-CM

## 2023-03-10 DIAGNOSIS — M4317 Spondylolisthesis, lumbosacral region: Secondary | ICD-10-CM

## 2023-03-17 DIAGNOSIS — Z87898 Personal history of other specified conditions: Secondary | ICD-10-CM | POA: Diagnosis not present

## 2023-03-17 DIAGNOSIS — H2513 Age-related nuclear cataract, bilateral: Secondary | ICD-10-CM | POA: Diagnosis not present

## 2023-03-17 DIAGNOSIS — H401132 Primary open-angle glaucoma, bilateral, moderate stage: Secondary | ICD-10-CM | POA: Diagnosis not present

## 2023-03-17 DIAGNOSIS — H43391 Other vitreous opacities, right eye: Secondary | ICD-10-CM | POA: Diagnosis not present

## 2023-03-25 ENCOUNTER — Encounter: Payer: Self-pay | Admitting: Orthopedic Surgery

## 2023-04-06 ENCOUNTER — Encounter: Payer: Self-pay | Admitting: Emergency Medicine

## 2023-04-09 DIAGNOSIS — Z09 Encounter for follow-up examination after completed treatment for conditions other than malignant neoplasm: Secondary | ICD-10-CM | POA: Diagnosis not present

## 2023-04-09 DIAGNOSIS — Z86011 Personal history of benign neoplasm of the brain: Secondary | ICD-10-CM | POA: Diagnosis not present

## 2023-04-09 DIAGNOSIS — Z87898 Personal history of other specified conditions: Secondary | ICD-10-CM | POA: Diagnosis not present

## 2023-04-12 ENCOUNTER — Encounter: Payer: Self-pay | Admitting: Orthopedic Surgery

## 2023-04-14 ENCOUNTER — Encounter: Payer: Self-pay | Admitting: Orthopedic Surgery

## 2023-04-16 ENCOUNTER — Other Ambulatory Visit: Payer: 59

## 2023-06-09 DIAGNOSIS — D352 Benign neoplasm of pituitary gland: Secondary | ICD-10-CM | POA: Diagnosis not present

## 2023-06-09 DIAGNOSIS — E230 Hypopituitarism: Secondary | ICD-10-CM | POA: Diagnosis not present

## 2023-06-09 DIAGNOSIS — Z5181 Encounter for therapeutic drug level monitoring: Secondary | ICD-10-CM | POA: Diagnosis not present

## 2023-06-09 DIAGNOSIS — E119 Type 2 diabetes mellitus without complications: Secondary | ICD-10-CM | POA: Diagnosis not present

## 2023-07-20 DIAGNOSIS — M2578 Osteophyte, vertebrae: Secondary | ICD-10-CM | POA: Diagnosis not present

## 2023-07-20 DIAGNOSIS — M4317 Spondylolisthesis, lumbosacral region: Secondary | ICD-10-CM | POA: Diagnosis not present

## 2023-07-20 DIAGNOSIS — M545 Low back pain, unspecified: Secondary | ICD-10-CM | POA: Diagnosis not present

## 2023-07-20 DIAGNOSIS — G8929 Other chronic pain: Secondary | ICD-10-CM | POA: Diagnosis not present

## 2023-07-23 ENCOUNTER — Ambulatory Visit
Admission: RE | Admit: 2023-07-23 | Discharge: 2023-07-23 | Disposition: A | Discharge status: Home / Self Care | Payer: 59 | Attending: Gastroenterology | Admitting: Gastroenterology

## 2023-07-23 ENCOUNTER — Encounter
Admission: RE | Disposition: A | Discharge status: Home / Self Care | Payer: Self-pay | Source: Home / Self Care | Attending: Gastroenterology

## 2023-07-23 ENCOUNTER — Ambulatory Visit: Payer: 59

## 2023-07-23 DIAGNOSIS — F419 Anxiety disorder, unspecified: Secondary | ICD-10-CM | POA: Insufficient documentation

## 2023-07-23 DIAGNOSIS — Z7985 Long-term (current) use of injectable non-insulin antidiabetic drugs: Secondary | ICD-10-CM | POA: Insufficient documentation

## 2023-07-23 DIAGNOSIS — Z9049 Acquired absence of other specified parts of digestive tract: Secondary | ICD-10-CM | POA: Diagnosis not present

## 2023-07-23 DIAGNOSIS — Z7984 Long term (current) use of oral hypoglycemic drugs: Secondary | ICD-10-CM | POA: Insufficient documentation

## 2023-07-23 DIAGNOSIS — I10 Essential (primary) hypertension: Secondary | ICD-10-CM | POA: Diagnosis not present

## 2023-07-23 DIAGNOSIS — E785 Hyperlipidemia, unspecified: Secondary | ICD-10-CM | POA: Insufficient documentation

## 2023-07-23 DIAGNOSIS — K640 First degree hemorrhoids: Secondary | ICD-10-CM | POA: Insufficient documentation

## 2023-07-23 DIAGNOSIS — Z6835 Body mass index (BMI) 35.0-35.9, adult: Secondary | ICD-10-CM | POA: Insufficient documentation

## 2023-07-23 DIAGNOSIS — Z79899 Other long term (current) drug therapy: Secondary | ICD-10-CM | POA: Diagnosis not present

## 2023-07-23 DIAGNOSIS — E66813 Obesity, class 3: Secondary | ICD-10-CM | POA: Insufficient documentation

## 2023-07-23 DIAGNOSIS — Z1211 Encounter for screening for malignant neoplasm of colon: Secondary | ICD-10-CM | POA: Diagnosis not present

## 2023-07-23 DIAGNOSIS — E119 Type 2 diabetes mellitus without complications: Secondary | ICD-10-CM | POA: Diagnosis not present

## 2023-07-23 DIAGNOSIS — K621 Rectal polyp: Secondary | ICD-10-CM | POA: Insufficient documentation

## 2023-07-23 DIAGNOSIS — K589 Irritable bowel syndrome without diarrhea: Secondary | ICD-10-CM | POA: Insufficient documentation

## 2023-07-23 HISTORY — PX: COLONOSCOPY WITH PROPOFOL: SHX5780

## 2023-07-23 HISTORY — PX: POLYPECTOMY: SHX5525

## 2023-07-23 LAB — GLUCOSE, CAPILLARY: Glucose-Capillary: 109 mg/dL — ABNORMAL HIGH (ref 70–99)

## 2023-07-23 SURGERY — COLONOSCOPY WITH PROPOFOL
Anesthesia: General

## 2023-07-23 MED ORDER — SODIUM CHLORIDE 0.9 % IV SOLN
INTRAVENOUS | Status: DC
  Administered 2023-07-23: 20 mL/h via INTRAVENOUS

## 2023-07-23 MED ORDER — PROPOFOL 10 MG/ML IV BOLUS
INTRAVENOUS | Status: DC | PRN
  Administered 2023-07-23: 80 mg via INTRAVENOUS
  Administered 2023-07-23: 20 mg via INTRAVENOUS

## 2023-07-23 MED ORDER — PROPOFOL 10 MG/ML IV BOLUS
INTRAVENOUS | Status: AC
  Filled 2023-07-23: qty 40

## 2023-07-23 MED ORDER — PROPOFOL 500 MG/50ML IV EMUL
INTRAVENOUS | Status: DC | PRN
  Administered 2023-07-23: 140 ug/kg/min via INTRAVENOUS

## 2023-07-23 NOTE — Anesthesia Preprocedure Evaluation (Signed)
Anesthesia Evaluation  Patient identified by MRN, date of birth, ID band Patient awake    Reviewed: Allergy & Precautions, NPO status , Patient's Chart, lab work & pertinent test results  Airway Mallampati: II  TM Distance: >3 FB Neck ROM: full    Dental  (+) Teeth Intact   Pulmonary neg pulmonary ROS, sleep apnea and Continuous Positive Airway Pressure Ventilation    Pulmonary exam normal breath sounds clear to auscultation       Cardiovascular Exercise Tolerance: Good hypertension, Pt. on medications negative cardio ROS Normal cardiovascular exam Rhythm:Regular Rate:Normal     Neuro/Psych   Anxiety     S/p pituitary adenoma resection negative neurological ROS  negative psych ROS   GI/Hepatic negative GI ROS, Neg liver ROS,,,  Endo/Other  negative endocrine ROSdiabetes, Well Controlled, Type 2, Oral Hypoglycemic Agents  Class 3 obesity  Renal/GU negative Renal ROS  negative genitourinary   Musculoskeletal   Abdominal  (+) + obese  Peds negative pediatric ROS (+)  Hematology negative hematology ROS (+)   Anesthesia Other Findings Past Medical History: No date: Anxiety No date: Diabetes mellitus without complication (HCC) No date: Glaucoma No date: Hyperlipidemia No date: Hypertension No date: IBS (irritable bowel syndrome) No date: Sleep apnea No date: Sleep apnea  Past Surgical History: No date: APPENDECTOMY No date: CHOLECYSTECTOMY 01/15/2022: EXTRACORPOREAL SHOCK WAVE LITHOTRIPSY; Right     Comment:  Procedure: EXTRACORPOREAL SHOCK WAVE LITHOTRIPSY (ESWL);              Surgeon: Riki Altes, MD;  Location: [facility] ORS;                Service: Urology;  Laterality: Right;  BMI    Body Mass Index: 35.77 kg/m      Reproductive/Obstetrics negative OB ROS                             Anesthesia Physical Anesthesia Plan  ASA: 3  Anesthesia Plan: General   Post-op Pain  Management:    Induction: Intravenous  PONV Risk Score and Plan: Propofol infusion and TIVA  Airway Management Planned: Natural Airway and Nasal Cannula  Additional Equipment:   Intra-op Plan:   Post-operative Plan:   Informed Consent: I have reviewed the patients History and Physical, chart, labs and discussed the procedure including the risks, benefits and alternatives for the proposed anesthesia with the patient or authorized representative who has indicated his/her understanding and acceptance.     Dental Advisory Given  Plan Discussed with: CRNA and Surgeon  Anesthesia Plan Comments:        Anesthesia Quick Evaluation

## 2023-07-23 NOTE — H&P (Signed)
Outpatient short stay form Pre-procedure 07/23/2023  Regis Bill, MD  Primary Physician: Gracelyn Nurse, MD  Reason for visit:  Screening  History of present illness:    57 y/o gentleman with history of hypertension, DM II, and IBS here for screening colonoscopy. Last colonoscopy was over 15 years ago with no reports available. No blood thinners. No family history of GI malignancies. History of cholecystectomy and appendectomy.    Current Facility-Administered Medications:    0.9 %  sodium chloride infusion, , Intravenous, Continuous, Tawona Filsinger, Rossie Muskrat, MD, Last Rate: 20 mL/hr at 07/23/23 0919, 20 mL/hr at 07/23/23 0919  Medications Prior to Admission  Medication Sig Dispense Refill Last Dose   ALPRAZolam (XANAX) 0.25 MG tablet TAKE 1 TABLET BY MOUTH NIGHTLY AS NEEDED FOR SLEEP   07/22/2023   amLODipine (NORVASC) 10 MG tablet Take 10 mg by mouth daily.   07/23/2023 at 0800   cabergoline (DOSTINEX) 0.5 MG tablet Take 0.5 mg by mouth 3 (three) times a week.   Past Week   cephALEXin (KEFLEX) 500 MG capsule Take 1 capsule (500 mg total) by mouth 2 (two) times daily. 14 capsule 0 Past Week   dorzolamide-timolol (COSOPT) 22.3-6.8 MG/ML ophthalmic solution Apply to eye.   07/22/2023   famotidine (PEPCID) 20 MG tablet Take by mouth.   Past Week   latanoprost (XALATAN) 0.005 % ophthalmic solution 1 drop at bedtime.   07/22/2023   losartan-hydrochlorothiazide (HYZAAR) 100-12.5 MG tablet Take by mouth.   Past Week   naproxen (NAPROSYN) 500 MG tablet Take 1 tablet (500 mg total) by mouth 2 (two) times daily with a meal. 20 tablet 2 Past Week   ondansetron (ZOFRAN-ODT) 4 MG disintegrating tablet Take 1 tablet (4 mg total) by mouth every 8 (eight) hours as needed for nausea or vomiting. 20 tablet 0 Past Week   phentermine 37.5 MG capsule Take 37.5 mg by mouth every morning.   Past Week   sildenafil (REVATIO) 20 MG tablet Take by mouth.   Past Month   tamsulosin (FLOMAX) 0.4 MG CAPS  capsule Take 1 capsule (0.4 mg total) by mouth daily. 14 capsule 0 Past Week   Testosterone 20.25 MG/ACT (1.62%) GEL PLEASE SEE ATTACHED FOR DETAILED DIRECTIONS   Past Week   TRULICITY 3 MG/0.5ML SOPN Inject into the skin.   Past Week   metFORMIN (GLUCOPHAGE) 500 MG tablet Take 1 tablet (500 mg total) by mouth daily. (Patient not taking: Reported on 01/15/2022) 30 tablet 0      No Known Allergies   Past Medical History:  Diagnosis Date   Anxiety    Diabetes mellitus without complication (HCC)    Glaucoma    Hyperlipidemia    Hypertension    IBS (irritable bowel syndrome)    Sleep apnea    Sleep apnea     Review of systems:  Otherwise negative.    Physical Exam  Gen: Alert, oriented. Appears stated age.  HEENT: PERRLA. Lungs: No respiratory distress CV: RRR Abd: soft, benign, no masses Ext: No edema    Planned procedures: Proceed with colonoscopy. The patient understands the nature of the planned procedure, indications, risks, alternatives and potential complications including but not limited to bleeding, infection, perforation, damage to internal organs and possible oversedation/side effects from anesthesia. The patient agrees and gives consent to proceed.  Please refer to procedure notes for findings, recommendations and patient disposition/instructions.     Regis Bill, MD [facility]

## 2023-07-23 NOTE — Interval H&P Note (Signed)
History and Physical Interval Note:  07/23/2023 9:48 AM  Andre Savage  has presented today for surgery, with the diagnosis of Colon Cancer Screening.  The various methods of treatment have been discussed with the patient and family. After consideration of risks, benefits and other options for treatment, the patient has consented to  Procedure(s) with comments: COLONOSCOPY WITH PROPOFOL (N/A) - PATIENT SAID HE DOES NOT NEED AN INTERPRETER as a surgical intervention.  The patient's history has been reviewed, patient examined, no change in status, stable for surgery.  I have reviewed the patient's chart and labs.  Questions were answered to the patient's satisfaction.     Regis Bill  Ok to proceed with colonoscopy

## 2023-07-23 NOTE — Anesthesia Postprocedure Evaluation (Signed)
Anesthesia Post Note  Patient: Andre Savage  Procedure(s) Performed: COLONOSCOPY WITH PROPOFOL  Patient location during evaluation: PACU Anesthesia Type: General Level of consciousness: awake and awake and alert Pain management: pain level controlled Vital Signs Assessment: post-procedure vital signs reviewed and stable Respiratory status: spontaneous breathing Cardiovascular status: stable Anesthetic complications: no   No notable events documented.   Last Vitals:  Vitals:   07/23/23 1019 07/23/23 1029  BP: 113/62 (!) 142/84  Pulse:  62  Resp:    Temp:    SpO2: 95% 98%    Last Pain:  Vitals:   07/23/23 1029  TempSrc:   PainSc: 0-No pain                 VAN STAVEREN,Andre Savage

## 2023-07-23 NOTE — Transfer of Care (Signed)
Immediate Anesthesia Transfer of Care Note  Patient: Andre Savage  Procedure(s) Performed: COLONOSCOPY WITH PROPOFOL  Patient Location: PACU  Anesthesia Type:MAC  Level of Consciousness: awake  Airway & Oxygen Therapy: Patient Spontanous Breathing  Post-op Assessment: Report given to RN and Post -op Vital signs reviewed and stable  Post vital signs: Reviewed and stable  Last Vitals:  Vitals Value Taken Time  BP 113/62 07/23/23 1019  Temp    Pulse 84 07/23/23 1019  Resp    SpO2 98 % 07/23/23 1019  Vitals shown include unfiled device data.  Last Pain:  Vitals:   07/23/23 1019  TempSrc:   PainSc: 0-No pain         Complications: No notable events documented.

## 2023-07-23 NOTE — Op Note (Signed)
[facility] Patient Name: Andre Savage Procedure Date: 07/23/2023 9:40 AM MRN: [id number] Account #: [id number] Date of Birth: 1966-06-30 Admit Type: Outpatient Age: 57 Room: [facility] ENDO ROOM 3 Gender: Male Note Status: Finalized Instrument Name: Prentice Docker [id number] Procedure:             Colonoscopy Indications:           Screening for colorectal malignant neoplasm Providers:             Eather Colas MD, MD Medicines:             Monitored Anesthesia Care Complications:         No immediate complications. Estimated blood loss:                         Minimal. Procedure:             Pre-Anesthesia Assessment:                        - Prior to the procedure, a History and Physical was                         performed, and patient medications and allergies were                         reviewed. The patient is competent. The risks and                         benefits of the procedure and the sedation options and                         risks were discussed with the patient. All questions                         were answered and informed consent was obtained.                         Patient identification and proposed procedure were                         verified by the physician, the nurse, the                         anesthesiologist, the anesthetist and the technician                         in the endoscopy suite. Mental Status Examination:                         alert and oriented. Airway Examination: normal                         oropharyngeal airway and neck mobility. Respiratory                         Examination: clear to auscultation. CV Examination:                         normal. Prophylactic Antibiotics: The patient does not  require prophylactic antibiotics. Prior                         Anticoagulants: The patient has taken no anticoagulant                         or antiplatelet agents. ASA  Grade Assessment: III - A                         patient with severe systemic disease. After reviewing                         the risks and benefits, the patient was deemed in                         satisfactory condition to undergo the procedure. The                         anesthesia plan was to use monitored anesthesia care                         (MAC). Immediately prior to administration of                         medications, the patient was re-assessed for adequacy                         to receive sedatives. The heart rate, respiratory                         rate, oxygen saturations, blood pressure, adequacy of                         pulmonary ventilation, and response to care were                         monitored throughout the procedure. The physical                         status of the patient was re-assessed after the                         procedure.                        After obtaining informed consent, the colonoscope was                         passed under direct vision. Throughout the procedure,                         the patient's blood pressure, pulse, and oxygen                         saturations were monitored continuously. The                         Colonoscope was introduced through the anus and  advanced to the the cecum, identified by appendiceal                         orifice and ileocecal valve. The colonoscopy was                         performed without difficulty. The patient tolerated                         the procedure well. The quality of the bowel                         preparation was good. The ileocecal valve, appendiceal                         orifice, and rectum were photographed. Findings:      The perianal and digital rectal examinations were normal.      A 3 mm polyp was found in the rectum. The polyp was sessile. The polyp       was removed with a cold snare. Resection and retrieval were complete.        Estimated blood loss was minimal.      Internal hemorrhoids were found during retroflexion. The hemorrhoids       were Grade I (internal hemorrhoids that do not prolapse).      The exam was otherwise without abnormality on direct and retroflexion       views. Impression:            - One 3 mm polyp in the rectum, removed with a cold                         snare. Resected and retrieved.                        - Internal hemorrhoids.                        - The examination was otherwise normal on direct and                         retroflexion views. Recommendation:        - Discharge patient to home.                        - Resume previous diet.                        - Continue present medications.                        - Await pathology results.                        - Repeat colonoscopy in 7-10 years for surveillance.                        - Return to referring physician as previously                         scheduled. Procedure Code(s):     --- Professional ---  [address], Colonoscopy, flexible; with removal of                         tumor(s), polyp(s), or other lesion(s) by snare                         technique Diagnosis Code(s):     --- Professional ---                        Z12.11, Encounter for screening for malignant neoplasm                         of colon                        D12.8, Benign neoplasm of rectum                        K64.0, First degree hemorrhoids CPT copyright 2022 American [employer]. All rights reserved. The codes documented in this report are preliminary and upon coder review may  be revised to meet current compliance requirements. Eather Colas MD, MD 07/23/2023 10:18:57 AM Number of Addenda: 0 Note Initiated On: 07/23/2023 9:40 AM Scope Withdrawal Time: 0 hours 12 minutes 5 seconds  Total Procedure Duration: 0 hours 16 minutes 40 seconds  Estimated Blood Loss:  Estimated blood loss was minimal.      [facility]

## 2023-07-26 ENCOUNTER — Encounter: Payer: Self-pay | Admitting: Gastroenterology

## 2023-07-26 LAB — SURGICAL PATHOLOGY

## 2023-10-13 ENCOUNTER — Ambulatory Visit: Payer: No Typology Code available for payment source | Admitting: Nurse Practitioner

## 2023-10-20 ENCOUNTER — Ambulatory Visit (INDEPENDENT_AMBULATORY_CARE_PROVIDER_SITE_OTHER): Payer: No Typology Code available for payment source | Admitting: Nurse Practitioner

## 2023-10-20 ENCOUNTER — Encounter: Payer: Self-pay | Admitting: Nurse Practitioner

## 2023-10-20 VITALS — BP 110/76 | HR 60 | Temp 98.3°F | Resp 16 | Ht 66.0 in | Wt 221.4 lb

## 2023-10-20 DIAGNOSIS — E1159 Type 2 diabetes mellitus with other circulatory complications: Secondary | ICD-10-CM | POA: Diagnosis not present

## 2023-10-20 DIAGNOSIS — E230 Hypopituitarism: Secondary | ICD-10-CM | POA: Diagnosis not present

## 2023-10-20 DIAGNOSIS — Z125 Encounter for screening for malignant neoplasm of prostate: Secondary | ICD-10-CM

## 2023-10-20 DIAGNOSIS — H4010X0 Unspecified open-angle glaucoma, stage unspecified: Secondary | ICD-10-CM | POA: Diagnosis not present

## 2023-10-20 DIAGNOSIS — E119 Type 2 diabetes mellitus without complications: Secondary | ICD-10-CM

## 2023-10-20 DIAGNOSIS — E785 Hyperlipidemia, unspecified: Secondary | ICD-10-CM

## 2023-10-20 DIAGNOSIS — Z86018 Personal history of other benign neoplasm: Secondary | ICD-10-CM

## 2023-10-20 DIAGNOSIS — I152 Hypertension secondary to endocrine disorders: Secondary | ICD-10-CM

## 2023-10-20 DIAGNOSIS — E1169 Type 2 diabetes mellitus with other specified complication: Secondary | ICD-10-CM | POA: Diagnosis not present

## 2023-10-20 DIAGNOSIS — F419 Anxiety disorder, unspecified: Secondary | ICD-10-CM

## 2023-10-20 DIAGNOSIS — E559 Vitamin D deficiency, unspecified: Secondary | ICD-10-CM

## 2023-10-20 LAB — POCT GLYCOSYLATED HEMOGLOBIN (HGB A1C): Hemoglobin A1C: 7 % — AB (ref 4.0–5.6)

## 2023-10-20 MED ORDER — CABERGOLINE 0.5 MG PO TABS: 0.5000 mg | ORAL_TABLET | ORAL | 3 refills | Status: DC

## 2023-10-20 MED ORDER — SEMAGLUTIDE (1 MG/DOSE) 4 MG/3ML ~~LOC~~ SOPN: 1.0000 mg | PEN_INJECTOR | SUBCUTANEOUS | 5 refills | Status: DC

## 2023-10-20 MED ORDER — ALPRAZOLAM 0.25 MG PO TABS: 0.2500 mg | ORAL_TABLET | Freq: Every evening | ORAL | 2 refills | Status: DC | PRN

## 2023-10-20 MED ORDER — DORZOLAMIDE HCL-TIMOLOL MAL 2-0.5 % OP SOLN: 1.0000 [drp] | Freq: Two times a day (BID) | OPHTHALMIC | 0 refills | Status: DC

## 2023-10-20 MED ORDER — LATANOPROST 0.005 % OP SOLN: 1.0000 [drp] | Freq: Every day | OPHTHALMIC | 0 refills | Status: DC

## 2023-10-20 MED ORDER — TESTOSTERONE 20.25 MG/ACT (1.62%) TD GEL: TRANSDERMAL | 2 refills | Status: AC

## 2023-10-20 MED ORDER — AMLODIPINE BESYLATE 10 MG PO TABS: 10.0000 mg | ORAL_TABLET | Freq: Every day | ORAL | 1 refills | Status: DC

## 2023-10-20 NOTE — Progress Notes (Signed)
 [facility] [address] [place], [place] [address]  Internal MEDICINE  Office Visit Note  Patient Name: Andre Savage  [id number]  [id number]  Date of Service: 10/20/2023   Complaints/HPI Pt is here for establishment of PCP. Chief Complaint  Patient presents with   New Patient (Initial Visit)    HPI [name] presents for a new patient visit to establish care.  Well-appearing 58 y.o. male with diabetes, glaucoma, hypertension, high cholesterol, anxiety, chronic TRT treatment, history of pituitary adenoma s/p surgical removal. Needing new referrals for ophthalmology and endocrinology due to insurance changes.  Work: Engineer, maintenance  Home: live at home with family  Diet: work in progress  Exercise: working on increasing exercise  Tobacco use: none  Alcohol use: 1 glass of wine weekly  Illicit drug use:  none  Routine CRC screening: normal screening done last year, due in 2034 Eye exam: going to be seeing a new eye doctor soon  foot exam: checked annually.  Labs: due for routine labs  New or worsening pain: none    Current Medication: Outpatient Encounter Medications as of 10/20/2023  Medication Sig   Semaglutide, 1 MG/DOSE, 4 MG/3ML SOPN Inject 1 mg as directed once a week.   ALPRAZolam (XANAX) 0.25 MG tablet Take 1 tablet (0.25 mg total) by mouth at bedtime as needed for sleep.   amLODipine (NORVASC) 10 MG tablet Take 1 tablet (10 mg total) by mouth daily.   cabergoline (DOSTINEX) 0.5 MG tablet Take 1 tablet (0.5 mg total) by mouth 3 (three) times a week.   dorzolamide-timolol (COSOPT) 2-0.5 % ophthalmic solution Place 1 drop into both eyes 2 (two) times daily.   latanoprost (XALATAN) 0.005 % ophthalmic solution Place 1 drop into both eyes at bedtime.   tadalafil (CIALIS) 5 MG tablet Take 10 mg by mouth daily as needed.   Testosterone 20.25 MG/ACT (1.62%) GEL Apply 2 gel packets to the skin daily   [DISCONTINUED] ALPRAZolam (XANAX) 0.25 MG tablet  TAKE 1 TABLET BY MOUTH NIGHTLY AS NEEDED FOR SLEEP   [DISCONTINUED] amLODipine (NORVASC) 10 MG tablet Take 10 mg by mouth daily.   [DISCONTINUED] cabergoline (DOSTINEX) 0.5 MG tablet Take 0.5 mg by mouth 3 (three) times a week.   [DISCONTINUED] cephALEXin (KEFLEX) 500 MG capsule Take 1 capsule (500 mg total) by mouth 2 (two) times daily.   [DISCONTINUED] dorzolamide-timolol (COSOPT) 22.3-6.8 MG/ML ophthalmic solution Apply to eye.   [DISCONTINUED] famotidine (PEPCID) 20 MG tablet Take by mouth.   [DISCONTINUED] latanoprost (XALATAN) 0.005 % ophthalmic solution 1 drop at bedtime.   [DISCONTINUED] losartan-hydrochlorothiazide (HYZAAR) 100-12.5 MG tablet Take by mouth.   [DISCONTINUED] metFORMIN (GLUCOPHAGE) 500 MG tablet Take 1 tablet (500 mg total) by mouth daily. (Patient not taking: Reported on 01/15/2022)   [DISCONTINUED] naproxen (NAPROSYN) 500 MG tablet Take 1 tablet (500 mg total) by mouth 2 (two) times daily with a meal.   [DISCONTINUED] ondansetron (ZOFRAN-ODT) 4 MG disintegrating tablet Take 1 tablet (4 mg total) by mouth every 8 (eight) hours as needed for nausea or vomiting.   [DISCONTINUED] OZEMPIC, 0.25 OR 0.5 MG/DOSE, 2 MG/3ML SOPN PLEASE SEE ATTACHED FOR DETAILED DIRECTIONS   [DISCONTINUED] phentermine 37.5 MG capsule Take 37.5 mg by mouth every morning.   [DISCONTINUED] sildenafil (REVATIO) 20 MG tablet Take by mouth.   [DISCONTINUED] tamsulosin (FLOMAX) 0.4 MG CAPS capsule Take 1 capsule (0.4 mg total) by mouth daily.   [DISCONTINUED] Testosterone 20.25 MG/ACT (1.62%) GEL PLEASE SEE ATTACHED FOR DETAILED DIRECTIONS   [  DISCONTINUED] TRULICITY 3 MG/0.5ML SOPN Inject into the skin.   No facility-administered encounter medications on file as of 10/20/2023.    Surgical History: Past Surgical History:  Procedure Laterality Date   APPENDECTOMY     CHOLECYSTECTOMY     COLONOSCOPY WITH PROPOFOL N/A 07/23/2023   Procedure: COLONOSCOPY WITH PROPOFOL;  Surgeon: Regis Bill, MD;   Location: [facility] ENDOSCOPY;  Service: Endoscopy;  Laterality: N/A;  PATIENT SAID HE DOES NOT NEED AN INTERPRETER   EXTRACORPOREAL SHOCK WAVE LITHOTRIPSY Right 01/15/2022   Procedure: EXTRACORPOREAL SHOCK WAVE LITHOTRIPSY (ESWL);  Surgeon: Riki Altes, MD;  Location: [facility] ORS;  Service: Urology;  Laterality: Right;   POLYPECTOMY  07/23/2023   Procedure: POLYPECTOMY;  Surgeon: Regis Bill, MD;  Location: [facility] ENDOSCOPY;  Service: Endoscopy;;    Medical History: Past Medical History:  Diagnosis Date   Anxiety    Diabetes mellitus without complication (HCC)    Glaucoma    Hyperlipidemia    Hypertension    IBS (irritable bowel syndrome)    Sleep apnea    Sleep apnea     Family History: Family History  Problem Relation Age of Onset   Hypertension Mother    Cancer Maternal Grandfather     Social History   Socioeconomic History   Marital status: Married    Spouse name: Not on file   Number of children: Not on file   Years of education: Not on file   Highest education level: Not on file  Occupational History   Not on file  Tobacco Use   Smoking status: Never    Passive exposure: Never   Smokeless tobacco: Never  Vaping Use   Vaping status: Never Used  Substance and Sexual Activity   Alcohol use: Yes    Comment: social    Drug use: Never   Sexual activity: Yes  Other Topics Concern   Not on file  Social History Narrative   Not on file   Social Drivers of Health   Financial Resource Strain: Not on file  Food Insecurity: Not on file  Transportation Needs: Not on file  Physical Activity: Not on file  Stress: Not on file  Social Connections: Not on file  Intimate Partner Violence: Not on file     Review of Systems  Constitutional:  Positive for fatigue. Negative for chills and unexpected weight change.  HENT:  Negative for congestion, postnasal drip, rhinorrhea, sneezing and sore throat.   Eyes:  Negative for redness.  Respiratory:  Negative for cough,  chest tightness, shortness of breath and wheezing.   Cardiovascular: Negative.  Negative for chest pain and palpitations.  Gastrointestinal:  Negative for abdominal pain, constipation, diarrhea, nausea and vomiting.  Genitourinary:  Negative for dysuria and frequency.  Musculoskeletal: Negative.  Negative for arthralgias, back pain, joint swelling and neck pain.  Skin:  Negative for rash.  Neurological: Negative.  Negative for tremors and numbness.  Hematological:  Negative for adenopathy. Does not bruise/bleed easily.  Psychiatric/Behavioral:  Negative for behavioral problems (Depression), self-injury, sleep disturbance and suicidal ideas. The patient is nervous/anxious.     Vital Signs: BP 110/76   Pulse 60   Temp 98.3 F (36.8 C)   Resp 16   Ht 5\' 6"  (1.676 m)   Wt 221 lb 6.4 oz (100.4 kg)   SpO2 96%   BMI 35.73 kg/m    Physical Exam Vitals reviewed.  Constitutional:      General: He is not in acute distress.  Appearance: Normal appearance. He is obese. He is not ill-appearing.  HENT:     Head: Normocephalic and atraumatic.  Eyes:     Pupils: Pupils are equal, round, and reactive to light.  Cardiovascular:     Rate and Rhythm: Normal rate and regular rhythm.  Pulmonary:     Effort: Pulmonary effort is normal. No respiratory distress.  Neurological:     Mental Status: He is alert and oriented to person, place, and time.  Psychiatric:        Mood and Affect: Mood normal.        Behavior: Behavior normal.       Assessment/Plan: 1. Type 2 diabetes mellitus with other specified complication, without long-term current use of insulin (HCC) (Primary) A1c is elevated at 7.0. continue ozempic as prescribed. Routine labs ordered. Endocrinology and ophthalmology referrals ordered.  - POCT glycosylated hemoglobin (Hb A1C) - Ambulatory referral to Endocrinology - Ambulatory referral to Ophthalmology - CBC with Differential/Platelet - Semaglutide, 1 MG/DOSE, 4 MG/3ML SOPN;  Inject 1 mg as directed once a week.  Dispense: 3 mL; Refill: 5 - CMP14+EGFR - Lipid Profile  2. Hypertension associated with type 2 diabetes mellitus (HCC) Stable, continue amlodipine as prescribed.  - amLODipine (NORVASC) 10 MG tablet; Take 1 tablet (10 mg total) by mouth daily.  Dispense: 90 tablet; Refill: 1  3. Hyperlipidemia associated with type 2 diabetes mellitus (HCC) Routine labs ordered  - CBC with Differential/Platelet - CMP14+EGFR - Lipid Profile - Vitamin D (25 hydroxy)  4. 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 TRT therapy ordered but may need to be continued by specialist.  - tadalafil (CIALIS) 5 MG tablet; Take 10 mg by mouth daily as needed. - Testosterone 20.25 MG/ACT (1.62%) GEL; Apply 2 gel packets to the skin daily  Dispense: 75 g; Refill: 2 -testosterone, free and total  5. Open-angle glaucoma of both eyes, unspecified glaucoma stage, unspecified open-angle glaucoma type Referred to ophthalmology, current eye medications for glaucoma refills ordered.  - Ambulatory referral to Ophthalmology - dorzolamide-timolol (COSOPT) 2-0.5 % ophthalmic solution; Place 1 drop into both eyes 2 (two) times daily.  Dispense: 10 mL; Refill: 0 - latanoprost (XALATAN) 0.005 % ophthalmic solution; Place 1 drop into both eyes at bedtime.  Dispense: 2.5 mL; Refill: 0  6. Vitamin D deficiency Routine lab ordered  - Vitamin D (25 hydroxy)  7. History of pituitary adenoma Refills of longstanding medications prescribed while patient is waiting for specialist referrals. These may require an order from the specialist and may not be approved from PCP. We will see what the insurance says  - Testosterone 20.25 MG/ACT (1.62%) GEL; Apply 2 gel packets to the skin daily  Dispense: 75 g; Refill: 2 - cabergoline (DOSTINEX) 0.5 MG tablet; Take 1 tablet (0.5 mg total) by mouth 3 (three) times a week.  Dispense: 12 tablet; Refill: 3 -testosterone, free and  total  8. Screening for prostate cancer Routine psa lab ordered  - PSA Total (Reflex To Free)  9. Anxiety Prn alprazolam prescribed for anxiety and sleep - ALPRAZolam (XANAX) 0.25 MG tablet; Take 1 tablet (0.25 mg total) by mouth at bedtime as needed for sleep.  Dispense: 30 tablet; Refill: 2     General Counseling: Jemell verbalizes understanding of the findings of todays visit and agrees with plan of treatment. I have discussed any further diagnostic evaluation that may be needed or ordered today. We also reviewed his medications today. he has been encouraged to call the  office with any questions or concerns that should arise related to todays visit.    Orders Placed This Encounter  Procedures   CBC with Differential/Platelet   CMP14+EGFR   Lipid Profile   Vitamin D (25 hydroxy)   PSA Total (Reflex To Free)   Ambulatory referral to Endocrinology   Ambulatory referral to Ophthalmology   POCT glycosylated hemoglobin (Hb A1C)    Meds ordered this encounter  Medications   Testosterone 20.25 MG/ACT (1.62%) GEL    Sig: Apply 2 gel packets to the skin daily    Dispense:  75 g    Refill:  2   ALPRAZolam (XANAX) 0.25 MG tablet    Sig: Take 1 tablet (0.25 mg total) by mouth at bedtime as needed for sleep.    Dispense:  30 tablet    Refill:  2   amLODipine (NORVASC) 10 MG tablet    Sig: Take 1 tablet (10 mg total) by mouth daily.    Dispense:  90 tablet    Refill:  1   cabergoline (DOSTINEX) 0.5 MG tablet    Sig: Take 1 tablet (0.5 mg total) by mouth 3 (three) times a week.    Dispense:  12 tablet    Refill:  3    Fill new script   dorzolamide-timolol (COSOPT) 2-0.5 % ophthalmic solution    Sig: Place 1 drop into both eyes 2 (two) times daily.    Dispense:  10 mL    Refill:  0   latanoprost (XALATAN) 0.005 % ophthalmic solution    Sig: Place 1 drop into both eyes at bedtime.    Dispense:  2.5 mL    Refill:  0    For future refill   Semaglutide, 1 MG/DOSE, 4 MG/3ML SOPN     Sig: Inject 1 mg as directed once a week.    Dispense:  3 mL    Refill:  5    Fill new script, dx code E11.65    Return for CPE, Jarmar Rousseau PCP at earliest available opening, have labs drawn prior to office visit. .  Time spent:30 Minutes Time spent with patient included reviewing progress notes, labs, imaging studies, and discussing plan for follow up.   [place] Controlled Substance Database was reviewed by me for overdose risk score (ORS)   This patient was seen by Sallyanne Kuster, FNP-C in collaboration with Dr. Beverely Risen as a part of collaborative care agreement.   Tommi Crepeau R. Tedd Sias, MSN, FNP-C Internal Medicine

## 2023-10-21 ENCOUNTER — Telehealth: Payer: Self-pay

## 2023-10-21 NOTE — Telephone Encounter (Signed)
Completed P.A. for patient's Testosterone gel.

## 2023-11-01 ENCOUNTER — Telehealth: Payer: Self-pay | Admitting: Nurse Practitioner

## 2023-11-01 NOTE — Telephone Encounter (Addendum)
 Awaiting 10/20/23 office notes for Endocrinology & Ophthalmology referral-Andre Savage

## 2023-11-04 IMAGING — CR DG ABDOMEN 1V
1 series · 2 of 2 positions shown · non-contrast
Comparison: [DATE] [DATE], [DATE].  [DATE] [DATE], [DATE].

CLINICAL DATA: Preop for kidney stone removal.

EXAM:
ABDOMEN - 1 VIEW

[Series 1: dg abd 1 view · 0.14mm/px · 2 of 2 slices shown]
[im 1/2]
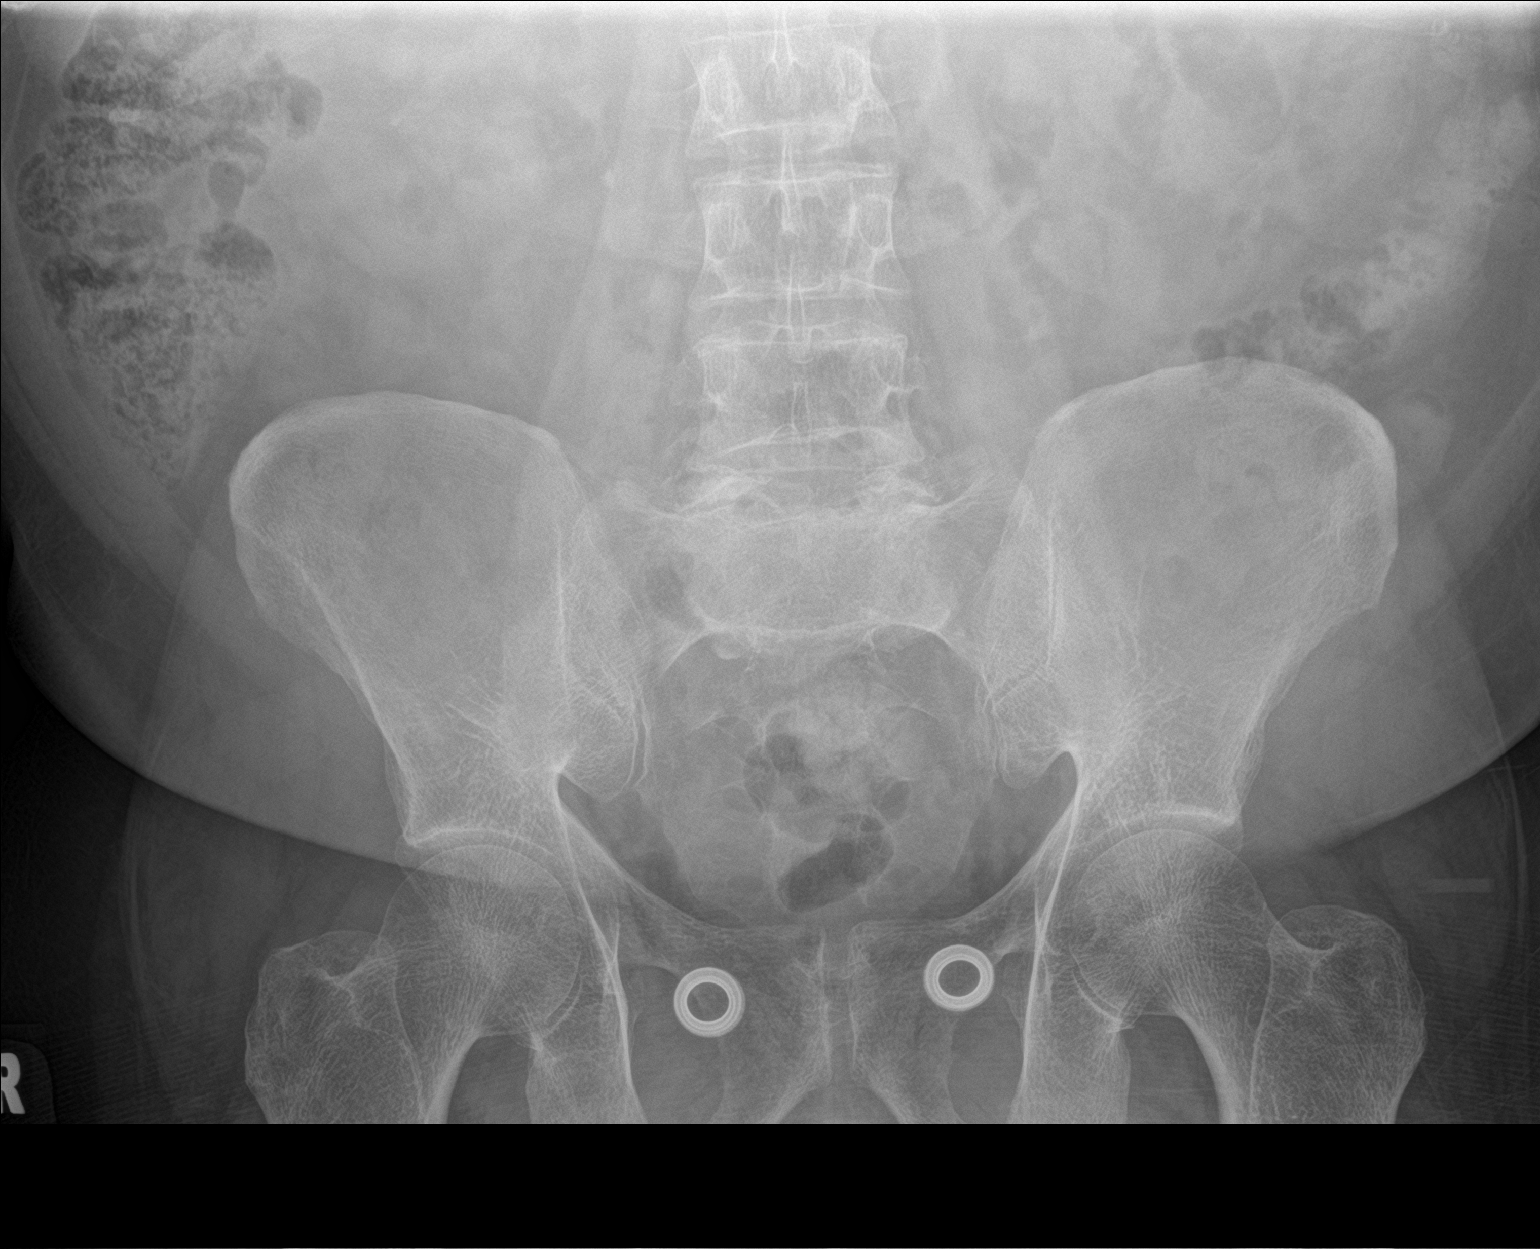
[im 2/2]
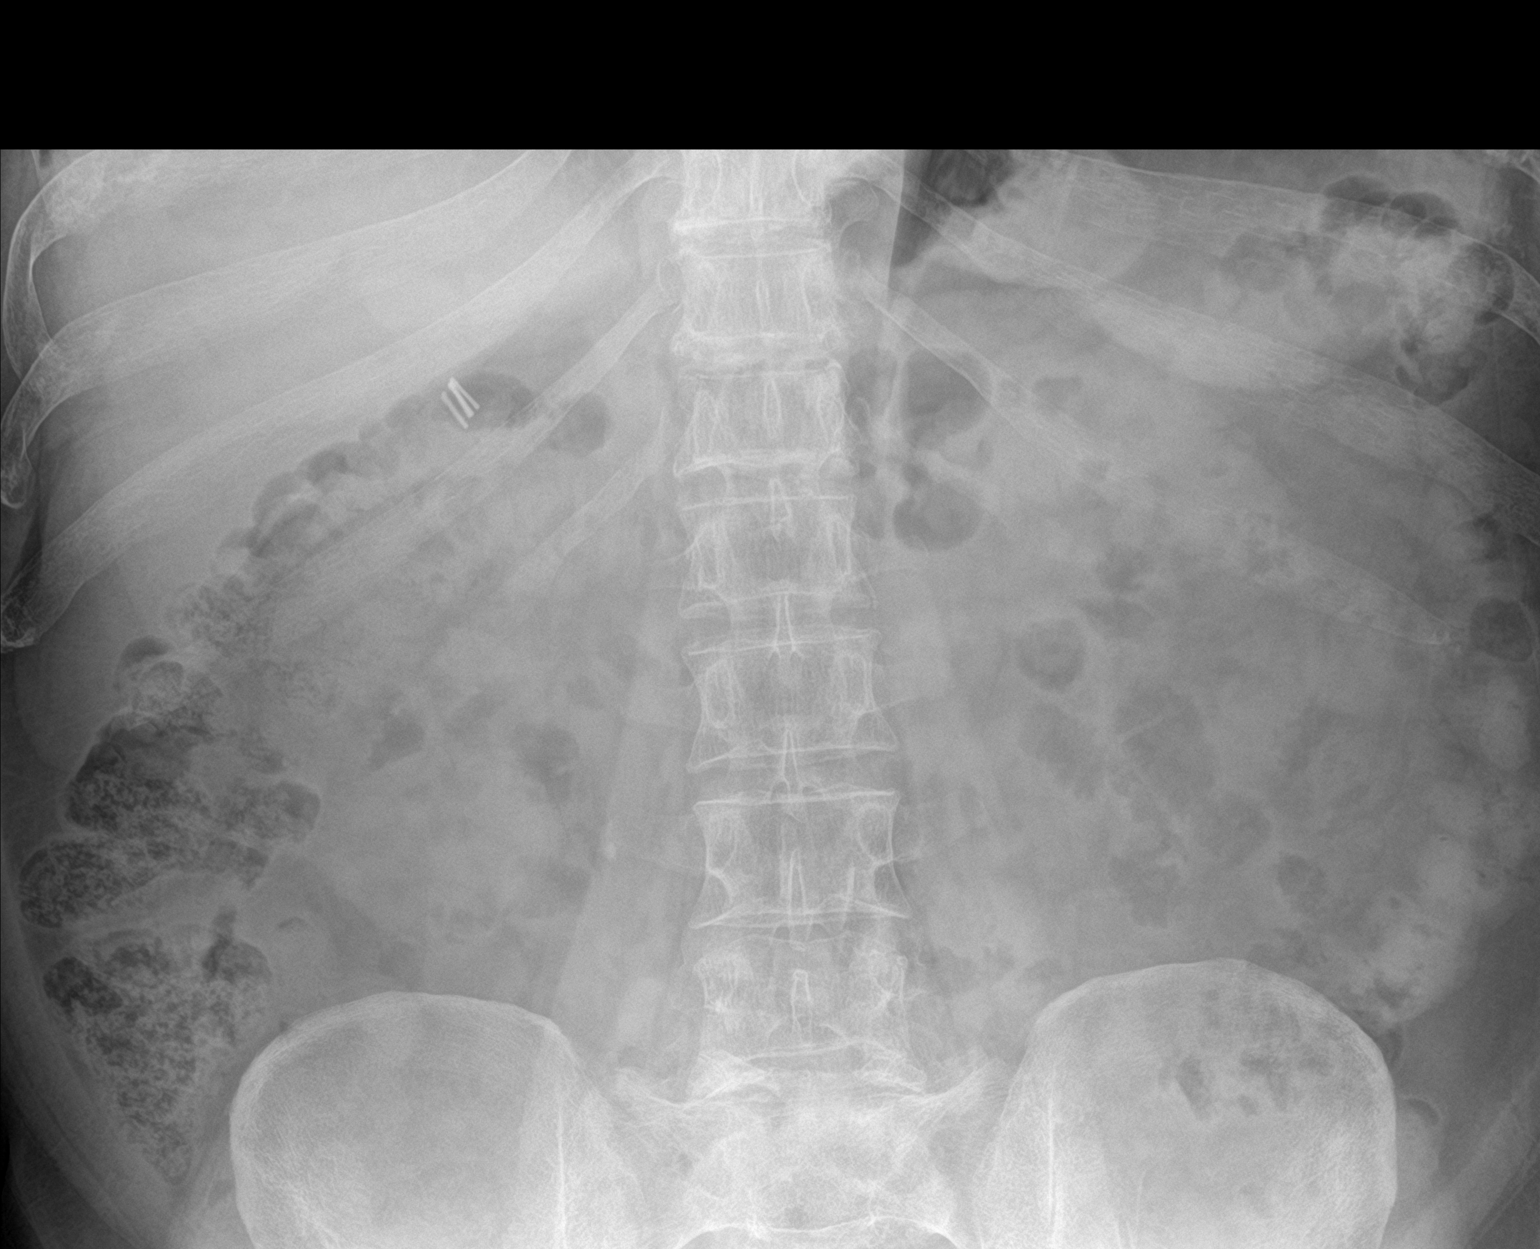

[2 of 2 positions shown; findings below may reference images not displayed]

FINDINGS: The bowel gas pattern is normal. Status post cholecystectomy. Stable
position of proximal right ureteral calculus seen to the right of L3
vertebral body
IMPRESSION: Stable position of proximal right renal calculus.

## 2023-11-15 ENCOUNTER — Encounter: Payer: Self-pay | Admitting: Nurse Practitioner

## 2023-11-15 DIAGNOSIS — D352 Benign neoplasm of pituitary gland: Secondary | ICD-10-CM

## 2023-11-15 DIAGNOSIS — E1169 Type 2 diabetes mellitus with other specified complication: Secondary | ICD-10-CM

## 2023-11-15 DIAGNOSIS — H4010X0 Unspecified open-angle glaucoma, stage unspecified: Secondary | ICD-10-CM

## 2023-11-15 DIAGNOSIS — E230 Hypopituitarism: Secondary | ICD-10-CM

## 2023-11-24 ENCOUNTER — Telehealth: Payer: Self-pay | Admitting: Nurse Practitioner

## 2023-11-24 NOTE — Telephone Encounter (Signed)
 Endocrinology referral faxed to Dr. Milana Na w/ [facility] ; [phone]. Notified patient. Gave pt telephone [phone]  Ophthalmology referral faxed to [facility] ; [phone]. Notified patient. Gave pt telephone [phone]

## 2023-11-25 ENCOUNTER — Telehealth: Payer: Self-pay | Admitting: Nurse Practitioner

## 2023-11-25 NOTE — Telephone Encounter (Signed)
 Left vm and sent mychart message to confirm 12/01/23 appointment-Toni

## 2023-11-26 LAB — CMP14+EGFR
ALT: 29 IU/L (ref 0–44)
AST: 19 IU/L (ref 0–40)
Albumin: 4.6 g/dL (ref 3.8–4.9)
Alkaline Phosphatase: 109 IU/L (ref 44–121)
BUN/Creatinine Ratio: 16 (ref 9–20)
BUN: 15 mg/dL (ref 6–24)
Bilirubin Total: 0.3 mg/dL (ref 0.0–1.2)
CO2: 26 mmol/L (ref 20–29)
Calcium: 9.7 mg/dL (ref 8.7–10.2)
Chloride: 101 mmol/L (ref 96–106)
Creatinine, Ser: 0.91 mg/dL (ref 0.76–1.27)
Globulin, Total: 2.5 g/dL (ref 1.5–4.5)
Glucose: 134 mg/dL — ABNORMAL HIGH (ref 70–99)
Potassium: 4.8 mmol/L (ref 3.5–5.2)
Sodium: 140 mmol/L (ref 134–144)
Total Protein: 7.1 g/dL (ref 6.0–8.5)
eGFR: 98 mL/min/{1.73_m2} (ref >59)

## 2023-11-26 LAB — LIPID PANEL
Chol/HDL Ratio: 4.7 ratio (ref 0.0–5.0)
Cholesterol, Total: 212 mg/dL — ABNORMAL HIGH (ref 100–199)
HDL: 45 mg/dL (ref >39)
LDL Chol Calc (NIH): 137 mg/dL — ABNORMAL HIGH (ref 0–99)
Triglycerides: 166 mg/dL — ABNORMAL HIGH (ref 0–149)
VLDL Cholesterol Cal: 30 mg/dL (ref 5–40)

## 2023-11-26 LAB — CBC WITH DIFFERENTIAL/PLATELET
Basophils Absolute: 0 10*3/uL (ref 0.0–0.2)
Basos: 1 %
EOS (ABSOLUTE): 0 10*3/uL (ref 0.0–0.4)
Eos: 1 %
Hematocrit: 43.6 % (ref 37.5–51.0)
Hemoglobin: 14.3 g/dL (ref 13.0–17.7)
Immature Grans (Abs): 0 10*3/uL (ref 0.0–0.1)
Immature Granulocytes: 0 %
Lymphocytes Absolute: 1.5 10*3/uL (ref 0.7–3.1)
Lymphs: 24 %
MCH: 29.5 pg (ref 26.6–33.0)
MCHC: 32.8 g/dL (ref 31.5–35.7)
MCV: 90 fL (ref 79–97)
Monocytes Absolute: 0.4 10*3/uL (ref 0.1–0.9)
Monocytes: 6 %
Neutrophils Absolute: 4.5 10*3/uL (ref 1.4–7.0)
Neutrophils: 68 %
Platelets: 251 10*3/uL (ref 150–450)
RBC: 4.84 x10E6/uL (ref 4.14–5.80)
RDW: 13.7 % (ref 11.6–15.4)
WBC: 6.5 10*3/uL (ref 3.4–10.8)

## 2023-11-26 LAB — PSA TOTAL (REFLEX TO FREE): Prostate Specific Ag, Serum: 0.3 ng/mL (ref 0.0–4.0)

## 2023-11-26 LAB — VITAMIN D 25 HYDROXY (VIT D DEFICIENCY, FRACTURES): Vit D, 25-Hydroxy: 22.6 ng/mL — ABNORMAL LOW (ref 30.0–100.0)

## 2023-12-01 ENCOUNTER — Encounter: Payer: Self-pay | Admitting: Nurse Practitioner

## 2023-12-01 ENCOUNTER — Ambulatory Visit: Payer: No Typology Code available for payment source | Admitting: Nurse Practitioner

## 2023-12-01 VITALS — BP 126/78 | HR 72 | Temp 98.4°F | Resp 16 | Ht 66.0 in | Wt 224.8 lb

## 2023-12-01 DIAGNOSIS — E785 Hyperlipidemia, unspecified: Secondary | ICD-10-CM | POA: Diagnosis not present

## 2023-12-01 DIAGNOSIS — Z0001 Encounter for general adult medical examination with abnormal findings: Secondary | ICD-10-CM

## 2023-12-01 DIAGNOSIS — I152 Hypertension secondary to endocrine disorders: Secondary | ICD-10-CM

## 2023-12-01 DIAGNOSIS — E1159 Type 2 diabetes mellitus with other circulatory complications: Secondary | ICD-10-CM | POA: Diagnosis not present

## 2023-12-01 DIAGNOSIS — E1169 Type 2 diabetes mellitus with other specified complication: Secondary | ICD-10-CM | POA: Diagnosis not present

## 2023-12-01 MED ORDER — TIRZEPATIDE 5 MG/0.5ML ~~LOC~~ SOAJ: 5.0000 mg | SUBCUTANEOUS | 1 refills | Status: DC

## 2023-12-01 NOTE — Progress Notes (Signed)
 [facility] [address] [place], [place] [address]  Internal MEDICINE  Office Visit Note  Patient Name: Andre Savage  [id number]  [id number]  Date of Service: 12/01/2023  Chief Complaint  Patient presents with   Diabetes   Hypertension   Hyperlipidemia   Annual Exam    HPI Aamari presents for an annual well visit and physical exam.  Well-appearing 58 y.o. male with diabetes, glaucoma, hypertension, high cholesterol, anxiety, chronic TRT treatment, history of pituitary adenoma s/p surgical removal. Needing new referrals for ophthalmology and endocrinology due to insurance changes  Routine CRC screening: due in 2034 Eye exam -- waiting for referral will call this week  foot exam: ok  Labs: recent lab results reviewed with patient.  Slightly low vitamin D  LDL elevated at 137, high triglycerides at 166. Elevated total cholesterol.  CBC, CMP PSA are normal A1c is elevated in February at 7.0 New or worsening pain: right knee pain  Other concerns: none     Current Medication: Outpatient Encounter Medications as of 12/01/2023  Medication Sig   ALPRAZolam  (XANAX ) 0.25 MG tablet Take 1 tablet (0.25 mg total) by mouth at bedtime as needed for sleep.   amLODipine  (NORVASC ) 10 MG tablet Take 1 tablet (10 mg total) by mouth daily.   cabergoline  (DOSTINEX ) 0.5 MG tablet Take 1 tablet (0.5 mg total) by mouth 3 (three) times a week.   dorzolamide -timolol  (COSOPT ) 2-0.5 % ophthalmic solution Place 1 drop into both eyes 2 (two) times daily.   latanoprost  (XALATAN ) 0.005 % ophthalmic solution Place 1 drop into both eyes at bedtime.   tadalafil (CIALIS) 5 MG tablet Take 10 mg by mouth daily as needed.   Testosterone  20.25 MG/ACT (1.62%) GEL Apply 2 gel packets to the skin daily   tirzepatide (MOUNJARO) 5 MG/0.5ML Pen Inject 5 mg into the skin once a week.   [DISCONTINUED] Semaglutide , 1 MG/DOSE, 4 MG/3ML SOPN Inject 1 mg as directed once a week.   No  facility-administered encounter medications on file as of 12/01/2023.    Surgical History: Past Surgical History:  Procedure Laterality Date   APPENDECTOMY     CHOLECYSTECTOMY     COLONOSCOPY WITH PROPOFOL  N/A 07/23/2023   Procedure: COLONOSCOPY WITH PROPOFOL ;  Surgeon: Shane Darling, MD;  Location: [facility];  Service: Endoscopy;  Laterality: N/A;  PATIENT SAID HE DOES NOT NEED AN INTERPRETER   EXTRACORPOREAL SHOCK WAVE LITHOTRIPSY Right 01/15/2022   Procedure: EXTRACORPOREAL SHOCK WAVE LITHOTRIPSY (ESWL);  Surgeon: Geraline Knapp, MD;  Location: [facility] ORS;  Service: Urology;  Laterality: Right;   POLYPECTOMY  07/23/2023   Procedure: POLYPECTOMY;  Surgeon: Shane Darling, MD;  Location: [facility] ENDOSCOPY;  Service: Endoscopy;;    Medical History: Past Medical History:  Diagnosis Date   Anxiety    Diabetes mellitus without complication (HCC)    Glaucoma    Hyperlipidemia    Hypertension    IBS (irritable bowel syndrome)    Sleep apnea    Sleep apnea     Family History: Family History  Problem Relation Age of Onset   Hypertension Mother    Cancer Maternal Grandfather     Social History   Socioeconomic History   Marital status: Married    Spouse name: Not on file   Number of children: Not on file   Years of education: Not on file   Highest education level: Not on file  Occupational History   Not on file  Tobacco Use   Smoking  status: Never    Passive exposure: Never   Smokeless tobacco: Never  Vaping Use   Vaping status: Never Used  Substance and Sexual Activity   Alcohol  use: Yes    Comment: social    Drug use: Never   Sexual activity: Yes  Other Topics Concern   Not on file  Social History Narrative   Not on file   Social Drivers of Health   Financial Resource Strain: Not on file  Food Insecurity: Not on file  Transportation Needs: Not on file  Physical Activity: Not on file  Stress: Not on file  Social Connections: Not on file   Intimate Partner Violence: Not on file      Review of Systems  Constitutional:  Positive for fatigue. Negative for activity change, appetite change, chills, fever and unexpected weight change.  HENT: Negative.  Negative for congestion, ear pain, postnasal drip, rhinorrhea, sneezing, sore throat and trouble swallowing.   Eyes: Negative.  Negative for redness.  Respiratory: Negative.  Negative for cough, chest tightness, shortness of breath and wheezing.   Cardiovascular: Negative.  Negative for chest pain and palpitations.  Gastrointestinal: Negative.  Negative for abdominal pain, blood in stool, constipation, diarrhea, nausea and vomiting.  Endocrine: Negative.   Genitourinary: Negative.  Negative for difficulty urinating, dysuria, frequency, hematuria and urgency.  Musculoskeletal: Negative.  Negative for arthralgias, back pain, joint swelling, myalgias and neck pain.  Skin: Negative.  Negative for rash and wound.  Allergic/Immunologic: Negative.  Negative for immunocompromised state.  Neurological: Negative.  Negative for dizziness, tremors, seizures, numbness and headaches.  Hematological: Negative.  Negative for adenopathy. Does not bruise/bleed easily.  Psychiatric/Behavioral:  Negative for behavioral problems (Depression), self-injury, sleep disturbance and suicidal ideas. The patient is nervous/anxious.     Vital Signs: BP 126/78   Pulse 72   Temp 98.4 F (36.9 C)   Resp 16   Ht 5\' 6"  (1.676 m)   Wt 224 lb 12.8 oz (102 kg)   SpO2 94%   BMI 36.28 kg/m    Physical Exam Vitals reviewed.  Constitutional:      General: He is not in acute distress.    Appearance: Normal appearance. He is obese. He is not ill-appearing.  HENT:     Head: Normocephalic and atraumatic.     Right Ear: Tympanic membrane, ear canal and external ear normal.     Left Ear: Tympanic membrane, ear canal and external ear normal.     Nose: Nose normal. No congestion or rhinorrhea.     Mouth/Throat:      Mouth: Mucous membranes are moist.     Pharynx: Oropharynx is clear. No oropharyngeal exudate or posterior oropharyngeal erythema.  Eyes:     General: No scleral icterus.       Right eye: No discharge.        Left eye: No discharge.     Extraocular Movements: Extraocular movements intact.     Conjunctiva/sclera: Conjunctivae normal.     Pupils: Pupils are equal, round, and reactive to light.  Neck:     Vascular: No carotid bruit.  Cardiovascular:     Rate and Rhythm: Normal rate and regular rhythm.     Pulses: Normal pulses.     Heart sounds: Normal heart sounds. No murmur heard. Pulmonary:     Effort: Pulmonary effort is normal. No respiratory distress.     Breath sounds: Normal breath sounds. No wheezing.  Abdominal:     General: Bowel sounds are normal. There  is no distension.     Palpations: Abdomen is soft. There is no mass.     Tenderness: There is no abdominal tenderness.     Hernia: No hernia is present.  Musculoskeletal:        General: Normal range of motion.     Cervical back: Normal range of motion.  Lymphadenopathy:     Cervical: No cervical adenopathy.  Skin:    General: Skin is warm and dry.     Capillary Refill: Capillary refill takes less than 2 seconds.  Neurological:     Mental Status: He is alert and oriented to person, place, and time.  Psychiatric:        Mood and Affect: Mood normal.        Behavior: Behavior normal.        Thought Content: Thought content normal.        Judgment: Judgment normal.        Assessment/Plan: 1. Encounter for routine adult health examination with abnormal findings (Primary) Age-appropriate preventive screenings and vaccinations discussed, annual physical exam completed. Routine labs for health maintenance results reviewed with the patient. Aaron Aas PHM updated.    2. Type 2 diabetes mellitus with other specified complication, without long-term current use of insulin (HCC) Start mounjaro as prescribed.  - tirzepatide  (MOUNJARO) 5 MG/0.5ML Pen; Inject 5 mg into the skin once a week.  Dispense: 6 mL; Refill: 1  3. Hypertension associated with type 2 diabetes mellitus (HCC) Stable, continue amlodipine  as prescribed.   4. Hyperlipidemia associated with type 2 diabetes mellitus (HCC) Not currently on any statin therapy. Limit red meat intake and increase lean proteins      General Counseling: Maximiliano verbalizes understanding of the findings of todays visit and agrees with plan of treatment. I have discussed any further diagnostic evaluation that may be needed or ordered today. We also reviewed his medications today. he has been encouraged to call the office with any questions or concerns that should arise related to todays visit.    No orders of the defined types were placed in this encounter.   Meds ordered this encounter  Medications   tirzepatide (MOUNJARO) 5 MG/0.5ML Pen    Sig: Inject 5 mg into the skin once a week.    Dispense:  6 mL    Refill:  1    Dx code E11.65 please send prior auth request asap if required.    Return in about 2 months (around 01/31/2024) for F/U, Recheck A1C, Shanena Pellegrino PCP.   Total time spent:30 Minutes Time spent includes review of chart, medications, test results, and follow up plan with the patient.    Controlled Substance Database was reviewed by me.  This patient was seen by Laurence Pons, FNP-C in collaboration with Dr. Verneta Gone as a part of collaborative care agreement.  Zakara Parkey R. Bobbi Burow, MSN, FNP-C Internal medicine

## 2023-12-09 ENCOUNTER — Telehealth: Payer: Self-pay | Admitting: Nurse Practitioner

## 2023-12-09 NOTE — Telephone Encounter (Signed)
 [facility] stating they still have not received referral via fax. Office verified fax # is correct. Told me to email to ; [email or web address]. Notified patient-Andre Savage

## 2023-12-10 MED ORDER — DORZOLAMIDE HCL-TIMOLOL MAL 2-0.5 % OP SOLN: 1.0000 [drp] | Freq: Two times a day (BID) | OPHTHALMIC | 0 refills | Status: DC

## 2023-12-10 NOTE — Addendum Note (Signed)
 Addended by: Sallyanne Kuster on: 12/10/2023 03:09 PM   Modules accepted: Orders

## 2023-12-16 ENCOUNTER — Other Ambulatory Visit: Payer: Self-pay | Admitting: Nurse Practitioner

## 2023-12-16 DIAGNOSIS — H4010X0 Unspecified open-angle glaucoma, stage unspecified: Secondary | ICD-10-CM

## 2023-12-16 NOTE — Telephone Encounter (Signed)
 Please review

## 2024-01-11 ENCOUNTER — Other Ambulatory Visit: Payer: Self-pay | Admitting: Nurse Practitioner

## 2024-01-11 DIAGNOSIS — H4010X0 Unspecified open-angle glaucoma, stage unspecified: Secondary | ICD-10-CM

## 2024-01-11 NOTE — Telephone Encounter (Signed)
 Please review

## 2024-01-14 ENCOUNTER — Other Ambulatory Visit: Payer: Self-pay

## 2024-01-14 ENCOUNTER — Other Ambulatory Visit: Payer: Self-pay | Admitting: Nurse Practitioner

## 2024-01-14 DIAGNOSIS — E230 Hypopituitarism: Secondary | ICD-10-CM

## 2024-01-14 MED ORDER — TADALAFIL 5 MG PO TABS
10.0000 mg | ORAL_TABLET | Freq: Every day | ORAL | 3 refills | Status: AC | PRN
  Filled 2024-01-14: qty 10, 5d supply, fill #0

## 2024-01-14 NOTE — Telephone Encounter (Signed)
 Please review and send

## 2024-01-18 ENCOUNTER — Encounter: Payer: Self-pay | Admitting: Nurse Practitioner

## 2024-01-21 ENCOUNTER — Encounter: Payer: Self-pay | Admitting: Nurse Practitioner

## 2024-01-25 ENCOUNTER — Other Ambulatory Visit: Payer: Self-pay | Admitting: Nurse Practitioner

## 2024-01-25 DIAGNOSIS — H4010X0 Unspecified open-angle glaucoma, stage unspecified: Secondary | ICD-10-CM

## 2024-02-01 ENCOUNTER — Ambulatory Visit: Admitting: Nurse Practitioner

## 2024-02-01 ENCOUNTER — Encounter: Payer: Self-pay | Admitting: Nurse Practitioner

## 2024-02-01 VITALS — BP 138/88 | HR 67 | Temp 98.1°F | Resp 16 | Ht 66.0 in | Wt 221.2 lb

## 2024-02-01 DIAGNOSIS — Z86018 Personal history of other benign neoplasm: Secondary | ICD-10-CM

## 2024-02-01 DIAGNOSIS — E230 Hypopituitarism: Secondary | ICD-10-CM

## 2024-02-01 DIAGNOSIS — D352 Benign neoplasm of pituitary gland: Secondary | ICD-10-CM | POA: Diagnosis not present

## 2024-02-01 DIAGNOSIS — E1169 Type 2 diabetes mellitus with other specified complication: Secondary | ICD-10-CM

## 2024-02-01 DIAGNOSIS — F419 Anxiety disorder, unspecified: Secondary | ICD-10-CM

## 2024-02-01 DIAGNOSIS — Z566 Other physical and mental strain related to work: Secondary | ICD-10-CM

## 2024-02-01 LAB — POCT GLYCOSYLATED HEMOGLOBIN (HGB A1C): Hemoglobin A1C: 6.2 % — AB (ref 4.0–5.6)

## 2024-02-01 MED ORDER — SILDENAFIL CITRATE 20 MG PO TABS: 20.0000 mg | ORAL_TABLET | Freq: Every day | ORAL | 0 refills | Status: DC | PRN

## 2024-02-01 MED ORDER — TIRZEPATIDE 7.5 MG/0.5ML ~~LOC~~ SOAJ: 7.5000 mg | SUBCUTANEOUS | 1 refills | Status: DC

## 2024-02-01 MED ORDER — ALPRAZOLAM 0.25 MG PO TABS: 0.2500 mg | ORAL_TABLET | Freq: Two times a day (BID) | ORAL | 2 refills | Status: DC | PRN

## 2024-02-01 NOTE — Progress Notes (Signed)
 [facility] [address] [place], [place] [address]  Internal MEDICINE  Office Visit Note  Patient Name: Andre Savage  [id number]  [id number]  Date of Service: 02/01/2024  Chief Complaint  Patient presents with   Diabetes   Hyperlipidemia   Hypertension   Follow-up    HPI Andre Savage presents for a follow-up visit for diabetes, increased stress, pituitary adenoma, TRT, ED, refills.  Diabetes -- A1c is significantly improved from 7.0 down to 6.2 today. He is on mounjaro  and wants to go up on the dose.  Increased stress and anxiety at work right now, has been taking alprazolam  twice daily. Needs new script  Prolactinoma/pituitary adenoma, s/p surgical removal TRT -- unable to get approved, patient aware that this will need to come from endocrinology ED -- takes tadalafil  or sildenafil  depending on how he is feeling  Has appt with new eye doctor next month in June    Current Medication: Outpatient Encounter Medications as of 02/01/2024  Medication Sig   amLODipine  (NORVASC ) 10 MG tablet Take 1 tablet (10 mg total) by mouth daily.   cabergoline  (DOSTINEX ) 0.5 MG tablet Take 1 tablet (0.5 mg total) by mouth 3 (three) times a week.   dorzolamide -timolol  (COSOPT ) 2-0.5 % ophthalmic solution INSTILL 1 DROP INTO BOTH EYES TWICE A DAY   latanoprost  (XALATAN ) 0.005 % ophthalmic solution INSTILL 1 DROP INTO BOTH EYES AT BEDTIME   sildenafil  (REVATIO ) 20 MG tablet Take 1 tablet (20 mg total) by mouth daily as needed.   tadalafil  (CIALIS ) 5 MG tablet Take 2 tablets (10 mg total) by mouth daily as needed.   Testosterone  20.25 MG/ACT (1.62%) GEL Apply 2 gel packets to the skin daily   tirzepatide  (MOUNJARO ) 7.5 MG/0.5ML Pen Inject 7.5 mg into the skin once a week.   [DISCONTINUED] ALPRAZolam  (XANAX ) 0.25 MG tablet Take 1 tablet (0.25 mg total) by mouth at bedtime as needed for sleep.   [DISCONTINUED] tirzepatide  (MOUNJARO ) 5 MG/0.5ML Pen Inject 5 mg into the skin once a week.    ALPRAZolam  (XANAX ) 0.25 MG tablet Take 1 tablet (0.25 mg total) by mouth 2 (two) times daily as needed for sleep or anxiety.   No facility-administered encounter medications on file as of 02/01/2024.    Surgical History: Past Surgical History:  Procedure Laterality Date   APPENDECTOMY     CHOLECYSTECTOMY     COLONOSCOPY WITH PROPOFOL  N/A 07/23/2023   Procedure: COLONOSCOPY WITH PROPOFOL ;  Surgeon: Shane Darling, MD;  Location: [facility];  Service: Endoscopy;  Laterality: N/A;  PATIENT SAID HE DOES NOT NEED AN INTERPRETER   EXTRACORPOREAL SHOCK WAVE LITHOTRIPSY Right 01/15/2022   Procedure: EXTRACORPOREAL SHOCK WAVE LITHOTRIPSY (ESWL);  Surgeon: Geraline Knapp, MD;  Location: [facility];  Service: Urology;  Laterality: Right;   POLYPECTOMY  07/23/2023   Procedure: POLYPECTOMY;  Surgeon: Shane Darling, MD;  Location: [facility];  Service: Endoscopy;;    Medical History: Past Medical History:  Diagnosis Date   Anxiety    Diabetes mellitus without complication (HCC)    Glaucoma    Hyperlipidemia    Hypertension    IBS (irritable bowel syndrome)    Sleep apnea    Sleep apnea     Family History: Family History  Problem Relation Age of Onset   Hypertension Mother    Cancer Maternal Grandfather     Social History   Socioeconomic History   Marital status: Married    Spouse name: Not on file   Number  of children: Not on file   Years of education: Not on file   Highest education level: Not on file  Occupational History   Not on file  Tobacco Use   Smoking status: Never    Passive exposure: Never   Smokeless tobacco: Never  Vaping Use   Vaping status: Never Used  Substance and Sexual Activity   Alcohol  use: Yes    Comment: social    Drug use: Never   Sexual activity: Yes  Other Topics Concern   Not on file  Social History Narrative   Not on file   Social Drivers of Health   Financial Resource Strain: Not on file  Food Insecurity: Not on  file  Transportation Needs: Not on file  Physical Activity: Not on file  Stress: Not on file  Social Connections: Not on file  Intimate Partner Violence: Not on file      Review of Systems  Constitutional:  Positive for fatigue. Negative for chills and unexpected weight change.  HENT:  Negative for congestion, postnasal drip, rhinorrhea, sneezing and sore throat.   Eyes:  Negative for redness.  Respiratory:  Negative for cough, chest tightness, shortness of breath and wheezing.   Cardiovascular: Negative.  Negative for chest pain and palpitations.  Gastrointestinal:  Negative for abdominal pain, constipation, diarrhea, nausea and vomiting.  Genitourinary:  Negative for dysuria and frequency.  Musculoskeletal: Negative.  Negative for arthralgias, back pain, joint swelling and neck pain.  Skin:  Negative for rash.  Neurological: Negative.  Negative for tremors and numbness.  Hematological:  Negative for adenopathy. Does not bruise/bleed easily.  Psychiatric/Behavioral:  Negative for behavioral problems (Depression), self-injury, sleep disturbance and suicidal ideas. The patient is nervous/anxious.     Vital Signs: BP 138/88   Pulse 67   Temp 98.1 F (36.7 C)   Resp 16   Ht 5\' 6"  (1.676 m)   Wt 221 lb 3.2 oz (100.3 kg)   SpO2 95%   BMI 35.70 kg/m    Physical Exam Vitals reviewed.  Constitutional:      General: He is not in acute distress.    Appearance: Normal appearance. He is obese. He is not ill-appearing.  HENT:     Head: Normocephalic and atraumatic.  Eyes:     Pupils: Pupils are equal, round, and reactive to light.  Cardiovascular:     Rate and Rhythm: Normal rate and regular rhythm.  Pulmonary:     Effort: Pulmonary effort is normal. No respiratory distress.  Neurological:     Mental Status: He is alert and oriented to person, place, and time.  Psychiatric:        Mood and Affect: Mood normal.        Behavior: Behavior normal.         Assessment/Plan: 1. Type 2 diabetes mellitus with other specified complication, without long-term current use of insulin (HCC) (Primary) A1c is improved and stable. Mounjaro  dose increased to also help with continued weight loss and A1c control. Follow up to repeat A1c in 4 months  - POCT glycosylated hemoglobin (Hb A1C) - tirzepatide  (MOUNJARO ) 7.5 MG/0.5ML Pen; Inject 7.5 mg into the skin once a week.  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 He does not take them together but likes to have the option of which one to take. Understands that he may have to pay out of pocket since his insurance may not cover 2 medications  from the same class.  - Ambulatory referral to Endocrinology - sildenafil  (REVATIO ) 20 MG tablet; Take 1 tablet (20 mg total) by mouth daily as needed.  Dispense: 10 tablet; Refill: 0  3. Prolactinoma (HCC) Referred to [facility] in [place].  - Ambulatory referral to Endocrinology  4. History of pituitary adenoma Referred to [facility] in [place].  - Ambulatory referral to Endocrinology  5. Anxiety Continue alprazolam  twice daily as needed. Follow up in 3 months for additional refills.  - ALPRAZolam  (XANAX ) 0.25 MG tablet; Take 1 tablet (0.25 mg total) by mouth 2 (two) times daily as needed for sleep or anxiety.  Dispense: 60 tablet; Refill: 2  6. Stress at work Increased stress at work, which is increasing his anxiety, alprazolam  increased a little for now.   General Counseling: Andre Savage verbalizes understanding of the findings of todays visit and agrees with plan of treatment. I have discussed any further diagnostic evaluation that may be needed or ordered today. We also reviewed his medications today. he has been encouraged to call the office with any questions or concerns that should arise related to todays visit.    Orders  Placed This Encounter  Procedures   Ambulatory referral to Endocrinology   POCT glycosylated hemoglobin (Hb A1C)    Meds ordered this encounter  Medications   ALPRAZolam  (XANAX ) 0.25 MG tablet    Sig: Take 1 tablet (0.25 mg total) by mouth 2 (two) times daily as needed for sleep or anxiety.    Dispense:  60 tablet    Refill:  2    Note increase in frequency and number of tablets, fill new script today.   sildenafil  (REVATIO ) 20 MG tablet    Sig: Take 1 tablet (20 mg total) by mouth daily as needed.    Dispense:  10 tablet    Refill:  0    Fill new script today. Patient does not take tadalafil  and sildenafil  at the same time. He will decide which one to take at the time he takes it.   tirzepatide  (MOUNJARO ) 7.5 MG/0.5ML Pen    Sig: Inject 7.5 mg into the skin once a week.    Dispense:  6 mL    Refill:  1    Fill new script today, note increased dose, discontinue 5 mg dose.    Return in about 3 months (around 04/26/2024) for F/U, anxiety med refill, Adasyn Mcadams PCP.   Total time spent:30 Minutes Time spent includes review of chart, medications, test results, and follow up plan with the patient.   [place] Controlled Substance Database was reviewed by me.  This patient was seen by Laurence Pons, FNP-C in collaboration with Dr. Verneta Gone as a part of collaborative care agreement.   Andre Roarty R. Bobbi Burow, MSN, FNP-C Internal medicine

## 2024-02-02 MED ORDER — ALPRAZOLAM 0.5 MG PO TABS: 0.5000 mg | ORAL_TABLET | Freq: Two times a day (BID) | ORAL | 2 refills | Status: DC | PRN

## 2024-02-04 MED ORDER — ACCU-CHEK SOFTCLIX LANCETS MISC: 12 refills | Status: AC

## 2024-02-04 NOTE — Addendum Note (Signed)
 Addended by: Manette Doto on: 02/04/2024 09:02 AM   Modules accepted: Orders

## 2024-02-06 ENCOUNTER — Encounter: Payer: Self-pay | Admitting: Nurse Practitioner

## 2024-02-21 ENCOUNTER — Other Ambulatory Visit: Payer: Self-pay | Admitting: Nurse Practitioner

## 2024-02-21 DIAGNOSIS — H4010X0 Unspecified open-angle glaucoma, stage unspecified: Secondary | ICD-10-CM

## 2024-02-21 MED ORDER — DORZOLAMIDE HCL-TIMOLOL MAL 2-0.5 % OP SOLN: 1.0000 [drp] | Freq: Two times a day (BID) | OPHTHALMIC | 0 refills | Status: DC

## 2024-03-07 ENCOUNTER — Other Ambulatory Visit: Payer: Self-pay | Admitting: Nurse Practitioner

## 2024-03-07 DIAGNOSIS — H4010X0 Unspecified open-angle glaucoma, stage unspecified: Secondary | ICD-10-CM

## 2024-03-19 ENCOUNTER — Other Ambulatory Visit: Payer: Self-pay | Admitting: Nurse Practitioner

## 2024-03-19 DIAGNOSIS — H4010X0 Unspecified open-angle glaucoma, stage unspecified: Secondary | ICD-10-CM

## 2024-03-20 NOTE — Telephone Encounter (Signed)
 Please review

## 2024-03-28 ENCOUNTER — Telehealth: Payer: Self-pay

## 2024-03-28 NOTE — Telephone Encounter (Signed)
 Patient PA was done and approved and patient was notified.

## 2024-04-08 ENCOUNTER — Other Ambulatory Visit: Payer: Self-pay | Admitting: Nurse Practitioner

## 2024-04-08 DIAGNOSIS — H4010X0 Unspecified open-angle glaucoma, stage unspecified: Secondary | ICD-10-CM

## 2024-04-13 LAB — HM DIABETES EYE EXAM

## 2024-04-24 ENCOUNTER — Other Ambulatory Visit: Payer: Self-pay | Admitting: Nurse Practitioner

## 2024-04-24 DIAGNOSIS — E230 Hypopituitarism: Secondary | ICD-10-CM

## 2024-04-24 DIAGNOSIS — H4010X0 Unspecified open-angle glaucoma, stage unspecified: Secondary | ICD-10-CM

## 2024-04-25 ENCOUNTER — Ambulatory Visit (INDEPENDENT_AMBULATORY_CARE_PROVIDER_SITE_OTHER): Admitting: Nurse Practitioner

## 2024-04-25 ENCOUNTER — Encounter: Payer: Self-pay | Admitting: Nurse Practitioner

## 2024-04-25 VITALS — BP 136/84 | HR 65 | Temp 97.1°F | Resp 16 | Ht 66.0 in | Wt 224.2 lb

## 2024-04-25 DIAGNOSIS — F419 Anxiety disorder, unspecified: Secondary | ICD-10-CM

## 2024-04-25 DIAGNOSIS — E1169 Type 2 diabetes mellitus with other specified complication: Secondary | ICD-10-CM | POA: Diagnosis not present

## 2024-04-25 DIAGNOSIS — I152 Hypertension secondary to endocrine disorders: Secondary | ICD-10-CM | POA: Diagnosis not present

## 2024-04-25 DIAGNOSIS — E230 Hypopituitarism: Secondary | ICD-10-CM | POA: Diagnosis not present

## 2024-04-25 DIAGNOSIS — H4010X0 Unspecified open-angle glaucoma, stage unspecified: Secondary | ICD-10-CM

## 2024-04-25 MED ORDER — ALPRAZOLAM 0.5 MG PO TABS: 0.5000 mg | ORAL_TABLET | Freq: Two times a day (BID) | ORAL | 2 refills | Status: DC | PRN

## 2024-04-25 MED ORDER — TIRZEPATIDE 7.5 MG/0.5ML ~~LOC~~ SOAJ: 7.5000 mg | SUBCUTANEOUS | 5 refills | Status: DC

## 2024-04-25 MED ORDER — SILDENAFIL CITRATE 20 MG PO TABS: 20.0000 mg | ORAL_TABLET | Freq: Every day | ORAL | 5 refills | Status: AC | PRN

## 2024-04-25 MED ORDER — AMLODIPINE BESYLATE 10 MG PO TABS: 10.0000 mg | ORAL_TABLET | Freq: Every day | ORAL | 1 refills | Status: DC

## 2024-04-25 NOTE — Progress Notes (Signed)
 [facility] [address] [place], [place] [address]  Internal MEDICINE  Office Visit Note  Patient Name: Andre Savage  [id number]  [id number]  Date of Service: 04/25/2024  Chief Complaint  Patient presents with   Diabetes   Hypertension   Hyperlipidemia   Follow-up    HPI Andre Savage presents for a follow-up visit for anxiety, diabetes, hypertension, ED, and eye problems, and refills.  Anxiety -- taking alprazolam  as needed  Eye problems -- using eye drops as prescribed by his eye doctor, he will be seeing a new eye doctor soon.  ED -- takes sildanfil as needed.  Hypertension -- controlled with amlodipine   Diabetes -- has upcoming appt with endocrinology.     Current Medication: Outpatient Encounter Medications as of 04/25/2024  Medication Sig   Accu-Chek Softclix Lancets lancets Check blood sugar once a day and prn for E11.65   ALPRAZolam  (XANAX ) 0.5 MG tablet Take 1 tablet (0.5 mg total) by mouth 2 (two) times daily as needed for anxiety.   amLODipine  (NORVASC ) 10 MG tablet Take 1 tablet (10 mg total) by mouth daily.   cabergoline  (DOSTINEX ) 0.5 MG tablet Take 1 tablet (0.5 mg total) by mouth 3 (three) times a week.   dorzolamide -timolol  (COSOPT ) 2-0.5 % ophthalmic solution INSTILL 1 DROP INTO BOTH EYES TWICE A DAY   latanoprost  (XALATAN ) 0.005 % ophthalmic solution INSTILL 1 DROP INTO BOTH EYES AT BEDTIME   sildenafil  (REVATIO ) 20 MG tablet Take 1 tablet (20 mg total) by mouth daily as needed.   tadalafil  (CIALIS ) 5 MG tablet Take 2 tablets (10 mg total) by mouth daily as needed.   Testosterone  20.25 MG/ACT (1.62%) GEL Apply 2 gel packets to the skin daily   tirzepatide  (MOUNJARO ) 7.5 MG/0.5ML Pen Inject 7.5 mg into the skin once a week.   [DISCONTINUED] ALPRAZolam  (XANAX ) 0.5 MG tablet Take 1 tablet (0.5 mg total) by mouth 2 (two) times daily as needed for anxiety.   [DISCONTINUED] amLODipine  (NORVASC ) 10 MG tablet Take 1 tablet (10 mg total) by mouth  daily.   [DISCONTINUED] sildenafil  (REVATIO ) 20 MG tablet Take 1 tablet (20 mg total) by mouth daily as needed.   [DISCONTINUED] tirzepatide  (MOUNJARO ) 7.5 MG/0.5ML Pen Inject 7.5 mg into the skin once a week.   No facility-administered encounter medications on file as of 04/25/2024.    Surgical History: Past Surgical History:  Procedure Laterality Date   APPENDECTOMY     CHOLECYSTECTOMY     COLONOSCOPY WITH PROPOFOL  N/A 07/23/2023   Procedure: COLONOSCOPY WITH PROPOFOL ;  Surgeon: Maryruth Ole DASEN, MD;  Location: [facility] ENDOSCOPY;  Service: Endoscopy;  Laterality: N/A;  PATIENT SAID HE DOES NOT NEED AN INTERPRETER   EXTRACORPOREAL SHOCK WAVE LITHOTRIPSY Right 01/15/2022   Procedure: EXTRACORPOREAL SHOCK WAVE LITHOTRIPSY (ESWL);  Surgeon: Twylla Glendia BROCKS, MD;  Location: [facility] ORS;  Service: Urology;  Laterality: Right;   POLYPECTOMY  07/23/2023   Procedure: POLYPECTOMY;  Surgeon: Maryruth Ole DASEN, MD;  Location: [facility] ENDOSCOPY;  Service: Endoscopy;;    Medical History: Past Medical History:  Diagnosis Date   Anxiety    Diabetes mellitus without complication (HCC)    Glaucoma    Hyperlipidemia    Hypertension    IBS (irritable bowel syndrome)    Sleep apnea    Sleep apnea     Family History: Family History  Problem Relation Age of Onset   Hypertension Mother    Cancer Maternal Grandfather     Social History   Socioeconomic History  Marital status: Married    Spouse name: Not on file   Number of children: Not on file   Years of education: Not on file   Highest education level: Not on file  Occupational History   Not on file  Tobacco Use   Smoking status: Never    Passive exposure: Never   Smokeless tobacco: Never  Vaping Use   Vaping status: Never Used  Substance and Sexual Activity   Alcohol  use: Yes    Comment: social    Drug use: Never   Sexual activity: Yes  Other Topics Concern   Not on file  Social History Narrative   Not on file   Social  Drivers of Health   Financial Resource Strain: Not on file  Food Insecurity: Not on file  Transportation Needs: Not on file  Physical Activity: Not on file  Stress: Not on file  Social Connections: Not on file  Intimate Partner Violence: Not on file      Review of Systems  Constitutional:  Positive for fatigue. Negative for chills and unexpected weight change.  HENT:  Negative for congestion, postnasal drip, rhinorrhea, sneezing and sore throat.   Eyes:  Negative for redness.  Respiratory:  Negative for cough, chest tightness, shortness of breath and wheezing.   Cardiovascular: Negative.  Negative for chest pain and palpitations.  Gastrointestinal:  Negative for abdominal pain, constipation, diarrhea, nausea and vomiting.  Genitourinary:  Negative for dysuria and frequency.  Musculoskeletal: Negative.  Negative for arthralgias, back pain, joint swelling and neck pain.  Skin:  Negative for rash.  Neurological: Negative.  Negative for tremors and numbness.  Hematological:  Negative for adenopathy. Does not bruise/bleed easily.  Psychiatric/Behavioral:  Negative for behavioral problems (Depression), self-injury, sleep disturbance and suicidal ideas. The patient is nervous/anxious.     Vital Signs: BP 136/84   Pulse 65   Temp (!) 97.1 F (36.2 C)   Resp 16   Ht 5' 6 (1.676 m)   Wt 224 lb 3.2 oz (101.7 kg)   SpO2 96%   BMI 36.19 kg/m    Physical Exam Vitals reviewed.  Constitutional:      General: He is not in acute distress.    Appearance: Normal appearance. He is obese. He is not ill-appearing.  HENT:     Head: Normocephalic and atraumatic.  Eyes:     Pupils: Pupils are equal, round, and reactive to light.  Cardiovascular:     Rate and Rhythm: Normal rate and regular rhythm.  Pulmonary:     Effort: Pulmonary effort is normal. No respiratory distress.  Neurological:     Mental Status: He is alert and oriented to person, place, and time.  Psychiatric:        Mood  and Affect: Mood normal.        Behavior: Behavior normal.        Assessment/Plan: 1. Type 2 diabetes mellitus with other specified complication, without long-term current use of insulin (HCC) Continue mounjaro  as prescribed and follow up with endocrinology as scheduled.   2. Hypertension associated with type 2 diabetes mellitus (HCC) Stable, Continue amlodipine  as prescribed.  - amLODipine  (NORVASC ) 10 MG tablet; Take 1 tablet (10 mg total) by mouth daily.  Dispense: 90 tablet; Refill: 1  3. Open-angle glaucoma of both eyes, unspecified glaucoma stage, unspecified open-angle glaucoma type Continue eye drops and follow up with eye doctor  4. 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  Dispense: 10 tablet; Refill: 5  5. Anxiety (Primary) Continue prn alprazolam  as prescribed  - ALPRAZolam  (XANAX ) 0.5 MG tablet; Take 1 tablet (0.5 mg total) by mouth 2 (two) times daily as needed for anxiety.  Dispense: 60 tablet; Refill: 2   General Counseling: Andre Savage verbalizes understanding of the findings of todays visit and agrees with plan of treatment. I have discussed any further diagnostic evaluation that may be needed or ordered today. We also reviewed his medications today. he has been encouraged to call the office with any questions or concerns that should arise related to todays visit.    No orders of the defined types were placed in this encounter.   Meds ordered this encounter  Medications   amLODipine  (NORVASC ) 10 MG tablet    Sig: Take 1 tablet (10 mg total) by mouth daily.    Dispense:  90 tablet    Refill:  1   ALPRAZolam  (XANAX ) 0.5 MG tablet    Sig: Take 1 tablet (0.5 mg total) by mouth 2 (two) times daily as needed for anxiety.    Dispense:  60 tablet    Refill:  2    Discontinue 0.25 mg dose and fill new script today.   sildenafil  (REVATIO ) 20  MG tablet    Sig: Take 1 tablet (20 mg total) by mouth daily as needed.    Dispense:  10 tablet    Refill:  5    Fill new script today. Patient does not take tadalafil  and sildenafil  at the same time. He will decide which one to take at the time he takes it.   tirzepatide  (MOUNJARO ) 7.5 MG/0.5ML Pen    Sig: Inject 7.5 mg into the skin once a week.    Dispense:  2 mL    Refill:  5    For future refills, keep on file    Return in about 3 months (around 07/19/2024) for F/U, anxiety med refill, Andre Savage PCP, Recheck A1C.   Total time spent:30 Minutes Time spent includes review of chart, medications, test results, and follow up plan with the patient.   Andre Savage Controlled Substance Database was reviewed by me.  This patient was seen by Mardy Maxin, FNP-C in collaboration with Dr. Sigrid Bathe as a part of collaborative care agreement.   Sven Pinheiro R. Maxin, MSN, FNP-C Internal medicine

## 2024-05-23 ENCOUNTER — Ambulatory Visit: Admitting: Endocrinology

## 2024-05-23 ENCOUNTER — Encounter: Payer: Self-pay | Admitting: Endocrinology

## 2024-05-23 VITALS — BP 118/80 | HR 64 | Resp 20 | Ht 66.0 in | Wt 220.8 lb

## 2024-05-23 DIAGNOSIS — E230 Hypopituitarism: Secondary | ICD-10-CM | POA: Diagnosis not present

## 2024-05-23 DIAGNOSIS — Z9889 Other specified postprocedural states: Secondary | ICD-10-CM

## 2024-05-23 DIAGNOSIS — D352 Benign neoplasm of pituitary gland: Secondary | ICD-10-CM

## 2024-05-23 DIAGNOSIS — E119 Type 2 diabetes mellitus without complications: Secondary | ICD-10-CM

## 2024-05-23 DIAGNOSIS — E221 Hyperprolactinemia: Secondary | ICD-10-CM

## 2024-05-23 DIAGNOSIS — E1169 Type 2 diabetes mellitus with other specified complication: Secondary | ICD-10-CM

## 2024-05-23 DIAGNOSIS — Z86018 Personal history of other benign neoplasm: Secondary | ICD-10-CM

## 2024-05-23 MED ORDER — TIRZEPATIDE 10 MG/0.5ML ~~LOC~~ SOAJ: 10.0000 mg | SUBCUTANEOUS | 3 refills | Status: AC

## 2024-05-23 MED ORDER — ROSUVASTATIN CALCIUM 10 MG PO TABS: 10.0000 mg | ORAL_TABLET | Freq: Every day | ORAL | 3 refills | Status: AC

## 2024-05-23 NOTE — Progress Notes (Unsigned)
 Outpatient Endocrinology Note [place] Nahara Dona, MD   Patient's Name: Andre Savage    DOB: 21-Sep-1965    MRN: [id number]                                                    REASON OF VISIT: New consult for type 2 diabetes mellitus and prolactinoma status post resection  REFERRING PROVIDER: Liana Fish, NP  PCP: Liana Fish, NP  HISTORY OF PRESENT ILLNESS:   Andre Savage is a 58 y.o. old male with past medical history listed below, is here for new consult for type 2 diabetes mellitus prolactinoma status post resection/ hypogonadism.   Pertinent History: Patient was following at [facility] and [facility] with endocrinology for type 2 diabetes mellitus, secondary hypogonadism and prolactinoma and establishing medical care in this area and presented today for endocrinology care.  Initial consult on May 23, 2024.  Medical records reviewed from the care everywhere.  # Hyperprolactinemia  / prolactinoma status post TSR /surgical resection/debulking of large 7 x 5 x 5.1 cm invasive sellar mass:  - Large sellar and skull base mass s/p endoscopic endonasal transphenoidal subtotal resection at [facility] with Dr. Marionette and Dr. Martyn at [facility] on 10/16/2019, pathology consistent with prolactinoma.  He has normal prolactin on cabergoline  0.5 mg 1 tablet 3 times a week.  Background: Presented with visual loss in early 2021. Formal visual field testing with bitemporal hemianopsia prompting imaging of the brain: large 7cm pituitary mass with invasion of the sphenoid/clivus, suprasellar cistern, bilateral cavernous sinuses, and carotid canals. Both carotids were encased. Referred to neurosurgery and ENT. He did not have preoperative hormonal testing completed. 10/16/19: endonasal approach and partial resection of a sellar/suprasellar/clival mass with right nasoseptal flap reconstruction. Residual tumor in the clivus, bilateral L>R cavernous sinus,multiple arteries in the  circle of willis.  Postop biochemical studies with PRL 15,000.  Started on cabergoline  0.25mg  three times per week. 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 POD2 cortisol was 14 (4am) and 24.6 (8am). He had transient SIADH postoperatively. 02/2020, PRL had normalized on CBG 0.5mg  3 times a week. Maintained levothyroxine. 08/2020 MRI: subtotal resection of sellar/suprasellar and skull base infiltrating mass wimilar volume of residual soft tissue in sellar/suprasellar space. 10/2020 Nl PRL on CBG 0.5mg  tab three times per week 01/2021 TSH/T4 normal no longer on thyroxine. 01/2021 despite normalization of PRL, still w persistent hypogonadism, thus, initiated testosterone  therapy.  IMAGING: 08/2020 MRI: Sequela of subtotal resection of a sellar suprasellar and skull base infiltrating mass similar volume of residual soft tissue in the sellar and suprasellar space. Unchanged central skull base and vascular enhancement. 02/2021 MRI: unchanged   MRI in August 2024 impression: Postsurgical changes of the pituitary gland with thickening of the infundibulum which is nonspecific but may represent a small amount of  residual tumor.  There was a plan to repeat MRI pituitary in 3 years ~fall of 2027.  Patient had seen neurosurgery in November 2024.  There was a plan to repeat.  MRI in 3 years around follow-up 2027.  Patient had seen ENT in November 2024.  # Hypogonadotropic/secondary hypogonadism : He develop hypogonadism after resection of large pituitary mass.  He preferred topical testosterone  over IM injection.  He was on  AndroGel  1% to 4 packets 100 mg total daily, maintaining therapeutic range of total testosterone , total testosterone  was 595 in June 2022.  He reported headache with sildenafil  and switch to tadalafil .  # Type 2 diabetes mellitus : Patient has type 2 diabetes mellitus diagnosed in 2010.  He has fair control type  2 diabetes mellitus.  He has never been on insulin therapy.  Hemoglobin A1c was 6.2% in May 2025.  History of DKA or diabetes related hospitalizations: none  Previous diabetes education: Yes   No personal history of pancreatitis and / or family history of medullary thyroid  carcinoma or MEN 2B syndrome.   Chronic Diabetes Complications : Retinopathy: no. Last ophthalmology exam was done on annually, following with ophthalmology regularly.  Nephropathy: no Peripheral neuropathy: on Coronary artery disease: no Stroke: no  Relevant comorbidities and cardiovascular risk factors: Obesity: yes Body mass index is 35.64 kg/m.  Hypertension: Yes  Hyperlipidemia : Yes, not on statin   Current / Home Diabetic regimen includes:  Mounjaro  7.5 mg weekly.  Prior diabetic medications: Trulicity 4.5 mg weekly, switched to Mounjaro . Metformin  stopped due to intolerance GI upset /diarrhea.  Trulicity was on and off due to [employer] supply.  Victoza was discontinued due to excessive co-pay cost. He had been on Ozempic  in the past as well.  Glycemic data:   No glucose data available to review.  He has been checking blood sugar with glucometer at home.  Hypoglycemia: Patient has no hypoglycemic episodes. Patient has hypoglycemia awareness.  Factors modifying glucose control: 1.  Diabetic diet assessment: 3 meals a day.  2.  Staying active or exercising:   3.  Medication compliance: compliant all of the time.  # He has obstructive sleep apnea on CPAP.  Interval history  Patient presented to establish endocrinology care.  He had invasive pituitary mass status post resection in February 2021.  He developed hypogonadism after pituitary surgery.  He had hyperprolactinemia and prolactin has remained normal with cabergoline  0.5 mg 1 tablet 3 times a week.  Last prolactin level was 15 July 2022.  He was transiently on thyroid  hormone replacement in the past.  He had no adrenal insufficiency  after the surgery.  He was taking AndroGel  1.62% 2 pumps daily however has not been taking for the last 2 months, was expensive, not able to refill.  He has low libido and low energy.  He denies any new vision problem.  No new headache.  No galactorrhea or breast tissue.  He has been taking cabergoline  0.5 mg 1 tablet 3 times a week.  Hemoglobin A1c was 6.2% in May.  He has been taking Mounjaro  7.5 mg weekly and tolerating well, denies any GI issues.  His medical insurance has changed.  He had normal hematocrit and liver enzymes and PSA was 0.3 in March 2025.  REVIEW OF SYSTEMS As per history of present illness.   PAST MEDICAL HISTORY: Past Medical History:  Diagnosis Date   Anxiety    Diabetes mellitus without complication (HCC)    Glaucoma    Hyperlipidemia    Hypertension    IBS (irritable bowel syndrome)    Sleep apnea    Sleep apnea     PAST SURGICAL HISTORY: Past Surgical History:  Procedure Laterality Date   APPENDECTOMY     CHOLECYSTECTOMY     COLONOSCOPY WITH PROPOFOL  N/A 07/23/2023   Procedure: COLONOSCOPY WITH PROPOFOL ;  Surgeon: Maryruth Ole DASEN, MD;  Location: [facility] ENDOSCOPY;  Service: Endoscopy;  Laterality: N/A;  PATIENT SAID HE DOES NOT NEED AN INTERPRETER   EXTRACORPOREAL SHOCK WAVE LITHOTRIPSY Right 01/15/2022   Procedure: EXTRACORPOREAL SHOCK WAVE LITHOTRIPSY (ESWL);  Surgeon: Twylla Glendia BROCKS, MD;  Location: [facility] ORS;  Service: Urology;  Laterality: Right;   POLYPECTOMY  07/23/2023   Procedure: POLYPECTOMY;  Surgeon: Maryruth Ole DASEN, MD;  Location: [facility] ENDOSCOPY;  Service: Endoscopy;;    ALLERGIES: No Known Allergies  FAMILY HISTORY:  Family History  Problem Relation Age of Onset   Hypertension Mother    Cancer Maternal Grandfather     SOCIAL HISTORY: Social History   Socioeconomic History   Marital status: Married    Spouse name: Not on file   Number of children: Not on file   Years of education: Not on file   Highest education  level: Not on file  Occupational History   Not on file  Tobacco Use   Smoking status: Never    Passive exposure: Never   Smokeless tobacco: Never  Vaping Use   Vaping status: Never Used  Substance and Sexual Activity   Alcohol  use: Yes    Comment: social    Drug use: Never   Sexual activity: Yes  Other Topics Concern   Not on file  Social History Narrative   Not on file   Social Drivers of Health   Financial Resource Strain: Not on file  Food Insecurity: Not on file  Transportation Needs: Not on file  Physical Activity: Not on file  Stress: Not on file  Social Connections: Not on file    MEDICATIONS:  Current Outpatient Medications  Medication Sig Dispense Refill   Accu-Chek Softclix Lancets lancets Check blood sugar once a day and prn for E11.65 100 each 12   ALPRAZolam  (XANAX ) 0.5 MG tablet Take 1 tablet (0.5 mg total) by mouth 2 (two) times daily as needed for anxiety. 60 tablet 2   amLODipine  (NORVASC ) 10 MG tablet Take 1 tablet (10 mg total) by mouth daily. 90 tablet 1   cabergoline  (DOSTINEX ) 0.5 MG tablet Take 1 tablet (0.5 mg total) by mouth 3 (three) times a week. 12 tablet 3   dorzolamide -timolol  (COSOPT ) 2-0.5 % ophthalmic solution INSTILL 1 DROP INTO BOTH EYES TWICE A DAY 10 mL 0   latanoprost  (XALATAN ) 0.005 % ophthalmic solution INSTILL 1 DROP INTO BOTH EYES AT BEDTIME 2.5 mL 0   rosuvastatin  (CRESTOR ) 10 MG tablet Take 1 tablet (10 mg total) by mouth daily. 90 tablet 3   sildenafil  (REVATIO ) 20 MG tablet Take 1 tablet (20 mg total) by mouth daily as needed. 10 tablet 5   tadalafil  (CIALIS ) 5 MG tablet Take 2 tablets (10 mg total) by mouth daily as needed. 10 tablet 3   Testosterone  20.25 MG/ACT (1.62%) GEL Apply 2 gel packets to the skin daily 75 g 2   tirzepatide  (MOUNJARO ) 10 MG/0.5ML Pen Inject 10 mg into the skin once a week. 6 mL 3   No current facility-administered medications for this visit.    PHYSICAL EXAM: Vitals:   05/23/24 1431  BP: 118/80   Pulse: 64  Resp: 20  SpO2: 98%  Weight: 220 lb 12.8 oz (100.2 kg)  Height: 5' 6 (1.676 m)   Body mass index is 35.64 kg/m.  Wt Readings from Last 3 Encounters:  05/23/24 220 lb 12.8 oz (100.2 kg)  04/25/24 224 lb 3.2 oz (101.7 kg)  02/01/24 221 lb 3.2 oz (100.3 kg)    General: Well developed, well nourished male in no apparent distress.  HEENT: AT/[place], no external lesions.  Eyes: Conjunctiva clear and no icterus.  Visual fields grossly intact by confrontation. Neck: Neck supple  Lungs: Respirations not labored Neurologic: Alert, oriented, normal speech Extremities / Skin: Dry.  Psychiatric: Does not appear depressed or anxious  Diabetic Foot Exam - Simple   No data filed    LABS Reviewed Lab Results  Component Value Date   HGBA1C 6.2 (A) 02/01/2024   HGBA1C 7.0 (A) 10/20/2023   No results found for: FRUCTOSAMINE Lab Results  Component Value Date   CHOL 212 (H) 11/25/2023   HDL 45 11/25/2023   LDLCALC 137 (H) 11/25/2023   TRIG 166 (H) 11/25/2023   CHOLHDL 4.7 11/25/2023   No results found for: MICRALBCREAT Lab Results  Component Value Date   CREATININE 0.91 11/25/2023   No results found for: GFR  ASSESSMENT / PLAN  1. Hyperprolactinemia (HCC)   2. Type 2 diabetes mellitus without complication, without long-term current use of insulin (HCC)   3. Hypogonadotropic hypogonadism (HCC)   4. Prolactinoma (HCC)   5. History of pituitary surgery     # Prolactinoma: - Patient was diagnosed with large invasive sellar mass 7.0 cm /prolactinoma status post resection and debulking surgery in February 2021 at [facility].  Postop prolactin biochemical studies was 15,000.  He has normal prolactin level on cabergoline  0.5 mg 1 tablet 3 times a week.  He transiently developed DI postoperatively.  No adrenal insufficiency.  He had been on thyroid  hormone replacement transiently postoperatively, until May 2022.  He developed hypogonadotropic hypogonadism/secondary hypogonadism  and started on testosterone  therapy in May 2022.  He used to to follow-up with endocrinology at [facility] and [facility] in the past. - He has been following with neurosurgery at [facility], last MRI pituitary was in August 2024 : Subtotal resection of the suprasellar infiltrating mass.  Plan to repeat MRI in 3 years follow-up 2027.  He has been following with ENT at [facility] as well. - Currently taking cabergoline  0.5 mg 1 tablet 3 times a week. - Hypogonadotropic hypogonadism : Was taking AndroGel  1.62% 2 pumps daily.  Last total testosterone  was 595 in June 2024.  He has not been taking testosterone  supplement for last 2 months not able to refill it and was expensive.  He prefers topical testosterone  over injectable testosterone  supplement.  Plan: - I would like to have laboratory tests for total and free testosterone  with gonadotropins. - Check prolactin level - I would like to check other pituitary hormones including ACTH, cortisol, IGF-I and thyroid  function test. - Will start testosterone  supplement after the test results.  Diabetes Mellitus type 2, complicated by no known complications. - Diabetic status / severity: Controlled.  Lab Results  Component Value Date   HGBA1C 6.2 (A) 02/01/2024    - Hemoglobin A1c goal : <6.5%  - Medications: See below.  I) increase Mounjaro  from 7.5 to 10 mg weekly.  - Home glucose testing: In the morning fasting and occasionally at bedtime.  Asked to bring glucometer in the follow-up visit. - Discussed/ Gave Hypoglycemia treatment plan.  # Consult : not required at this time.   # Annual urine for microalbuminuria/ creatinine ratio, no microalbuminuria currently, will check urine microalbumin creatinine ratio and BMP with eGFR.   Last No results found for: MICRALBCREAT  # Foot check nightly.  # Annual dilated diabetic eye exams.   - Diet: Make healthy diabetic food choices - Life style / activity / exercise: Discussed.  -Check labs for  hemoglobin A1c, urine microalbumin creatinine ratio and BMP.  2. Blood pressure  -  BP Readings from Last 1 Encounters:  05/23/24 118/80    - Control is in target.  - No change in current plans.  3. Lipid status / Hyperlipidemia - Last  Lab Results  Component Value Date   LDLCALC 137 (H) 11/25/2023   - Currently not on a statin.  Start rosuvastatin  10 mg daily.  Patient agreed to take statin medication.  Diagnoses and all orders for this visit:  Hyperprolactinemia (HCC)  Type 2 diabetes mellitus without complication, without long-term current use of insulin (HCC) -     tirzepatide  (MOUNJARO ) 10 MG/0.5ML Pen; Inject 10 mg into the skin once a week. -     rosuvastatin  (CRESTOR ) 10 MG tablet; Take 1 tablet (10 mg total) by mouth daily. -     Hemoglobin A1c -     Basic metabolic panel with GFR -     Microalbumin / creatinine urine ratio  Hypogonadotropic hypogonadism (HCC) -     Testosterone  , Free and Total -     Follicle stimulating hormone -     Luteinizing hormone  Prolactinoma (HCC) -     Prolactin -     T4, free -     TSH  History of pituitary surgery -     Cortisol -     ACTH -     Insulin-like growth factor   Patient will complete lab in the early morning fasting.  DISPOSITION Follow up in clinic in 3  months suggested.  Lab in few days in the early morning fasting and prior to follow-up visit.   All questions answered and patient verbalized understanding of the plan.  [place] Tashika Goodin, MD [facility] Endocrinology [facility] [address] [place], [address] [place], [place] [address] Phone # [phone]  At least part of this note was generated using voice recognition software. Inadvertent word errors may have occurred, which were not recognized during the proofreading process.

## 2024-05-31 ENCOUNTER — Other Ambulatory Visit

## 2024-06-06 ENCOUNTER — Encounter: Payer: Self-pay | Admitting: Nurse Practitioner

## 2024-06-08 LAB — TESTOSTERONE, FREE & TOTAL
Free Testosterone: 28.9 pg/mL — ABNORMAL LOW (ref 35.0–155.0)
Testosterone, Total, LC-MS-MS: 132 ng/dL — ABNORMAL LOW (ref 250–1100)

## 2024-06-08 LAB — BASIC METABOLIC PANEL WITH GFR
BUN: 17 mg/dL (ref 7–25)
CO2: 28 mmol/L (ref 20–32)
Calcium: 9.4 mg/dL (ref 8.6–10.3)
Chloride: 103 mmol/L (ref 98–110)
Creat: 0.87 mg/dL (ref 0.70–1.30)
Glucose, Bld: 124 mg/dL — ABNORMAL HIGH (ref 65–99)
Potassium: 4.1 mmol/L (ref 3.5–5.3)
Sodium: 139 mmol/L (ref 135–146)
eGFR: 101 mL/min/1.73m2 (ref >=60)

## 2024-06-08 LAB — PROLACTIN: Prolactin: 4.2 ng/mL (ref 2.0–18.0)

## 2024-06-08 LAB — MICROALBUMIN / CREATININE URINE RATIO
Creatinine, Urine: 177 mg/dL (ref 20–320)
Microalb Creat Ratio: 5 mg/g{creat} (ref <30)
Microalb, Ur: 0.8 mg/dL

## 2024-06-08 LAB — HEMOGLOBIN A1C
Hgb A1c MFr Bld: 6.5 % — ABNORMAL HIGH (ref <5.7)
Mean Plasma Glucose: 140 mg/dL
eAG (mmol/L): 7.7 mmol/L

## 2024-06-08 LAB — ACTH: C206 ACTH: 5 pg/mL — ABNORMAL LOW (ref 6–50)

## 2024-06-08 LAB — T4, FREE: Free T4: 1 ng/dL (ref 0.8–1.8)

## 2024-06-08 LAB — INSULIN-LIKE GROWTH FACTOR
IGF-I, LC/MS: 107 ng/mL (ref 50–317)
Z-Score (Male): -0.5 {STDV} (ref ?–2.0)

## 2024-06-08 LAB — LUTEINIZING HORMONE: LH: 1.7 m[IU]/mL (ref 1.5–9.3)

## 2024-06-08 LAB — TSH: TSH: 0.93 m[IU]/L (ref 0.40–4.50)

## 2024-06-08 LAB — CORTISOL: Cortisol, Plasma: 1 ug/dL — ABNORMAL LOW

## 2024-06-08 LAB — FOLLICLE STIMULATING HORMONE: FSH: 2.6 m[IU]/mL (ref 1.4–12.8)

## 2024-06-19 ENCOUNTER — Ambulatory Visit: Payer: Self-pay | Admitting: Endocrinology

## 2024-06-19 DIAGNOSIS — E230 Hypopituitarism: Secondary | ICD-10-CM

## 2024-06-19 DIAGNOSIS — Z86018 Personal history of other benign neoplasm: Secondary | ICD-10-CM

## 2024-06-19 MED ORDER — TESTOSTERONE 20.25 MG/1.25GM (1.62%) TD GEL: TRANSDERMAL | 1 refills | Status: AC

## 2024-06-19 NOTE — Progress Notes (Signed)
 Please notify patient of labs reviewed testosterone  levels are low, sent prescription for AndroGel  1.62% 2 pumps daily.  Prolactin normal continue current dose of cabergoline .  Normal thyroid  function test.  Normal electrolytes and normal urine microalbumin creatinine ratio.  He has significantly low cortisol and ACTH.  Concerning for adrenal insufficiency unless he had taken any recent systemic steroid.  Please check with the patient if he had taken any steroid injectable or oral for example prednisone, methylprednisolone, dexamethasone, triamcinolone, steroid injection into the joint etc?

## 2024-06-26 NOTE — Telephone Encounter (Signed)
 If you had taken medication containing steroid that can alter the test results for cortisol and ACTH, and in this context your test results are appropriate.  For rechecking lab, we can consider in the future, we need to wait at least few months after not being on any form of a steroid before rechecking.  In regard to pharmacy comment about testosterone  therapy.  I have sent prescription for AndroGel .  Not sure what more information is required for the pharmacy please asked to specify.  Iraq Zayon Trulson, MD [facility] [address] [place], [address] [place], [place] [address] Phone # [phone]

## 2024-07-18 ENCOUNTER — Ambulatory Visit: Admitting: Nurse Practitioner

## 2024-07-26 ENCOUNTER — Ambulatory Visit: Admitting: Nurse Practitioner

## 2024-07-27 ENCOUNTER — Encounter: Payer: Self-pay | Admitting: Endocrinology

## 2024-07-28 NOTE — Telephone Encounter (Signed)
 Does it means, it needs prior authorization?  Please check with the patient or [facility] if needed we can send for prior authorization.

## 2024-07-31 ENCOUNTER — Other Ambulatory Visit (HOSPITAL_COMMUNITY): Payer: Self-pay

## 2024-07-31 ENCOUNTER — Telehealth: Payer: Self-pay

## 2024-07-31 NOTE — Telephone Encounter (Signed)
 Pharmacy Patient Advocate Encounter   Received notification from Patient Advice Request messages that prior authorization for Testosterone  20.25 MG/1.25GM(1.62%) gel is required/requested.   Insurance verification completed.   The patient is insured through [facility] MEDICAID.   Per test claim: PA required; PA submitted to above mentioned insurance via Latent Key/confirmation #/EOC [id number] Status is pending

## 2024-08-04 ENCOUNTER — Other Ambulatory Visit: Payer: Self-pay | Admitting: Nurse Practitioner

## 2024-08-04 DIAGNOSIS — F419 Anxiety disorder, unspecified: Secondary | ICD-10-CM

## 2024-08-14 ENCOUNTER — Ambulatory Visit: Admitting: Nurse Practitioner

## 2024-08-14 NOTE — Telephone Encounter (Signed)
 Pharmacy Patient Advocate Encounter  Received notification from [facility] MEDICAID that Prior Authorization for Testosterone  20.25 MG/1.25GM(1.62%) gel  has been APPROVED from 08/01/24 to 11/01/2024

## 2024-08-15 ENCOUNTER — Encounter: Payer: Self-pay | Admitting: Nurse Practitioner

## 2024-08-15 ENCOUNTER — Ambulatory Visit: Admitting: Nurse Practitioner

## 2024-08-15 VITALS — BP 136/84 | HR 74 | Temp 95.0°F | Resp 16 | Ht 66.0 in | Wt 219.0 lb

## 2024-08-15 DIAGNOSIS — I152 Hypertension secondary to endocrine disorders: Secondary | ICD-10-CM

## 2024-08-15 DIAGNOSIS — E1169 Type 2 diabetes mellitus with other specified complication: Secondary | ICD-10-CM

## 2024-08-15 DIAGNOSIS — F419 Anxiety disorder, unspecified: Secondary | ICD-10-CM

## 2024-08-15 DIAGNOSIS — Z86018 Personal history of other benign neoplasm: Secondary | ICD-10-CM

## 2024-08-15 MED ORDER — ALPRAZOLAM 0.5 MG PO TABS: 0.5000 mg | ORAL_TABLET | Freq: Two times a day (BID) | ORAL | 0 refills | Status: AC | PRN

## 2024-08-15 MED ORDER — AMLODIPINE BESYLATE 10 MG PO TABS: 10.0000 mg | ORAL_TABLET | Freq: Every day | ORAL | 1 refills | Status: AC

## 2024-08-15 NOTE — Progress Notes (Unsigned)
 [facility] [address] [place], [place] [address]  Internal MEDICINE  Office Visit Note  Patient Name: Andre Savage  [id number]  [id number]  Date of Service: 08/15/2024  Chief Complaint  Patient presents with   Diabetes   Hypertension   Hyperlipidemia   Follow-up    HPI Andre Savage presents for a follow-up visit for  Diabetes -- on mounjaro      Current Medication: Outpatient Encounter Medications as of 08/15/2024  Medication Sig   Accu-Chek Softclix Lancets lancets Check blood sugar once a day and prn for E11.65   ALPRAZolam  (XANAX ) 0.5 MG tablet Take 1 tablet (0.5 mg total) by mouth 2 (two) times daily as needed for anxiety.   amLODipine  (NORVASC ) 10 MG tablet Take 1 tablet (10 mg total) by mouth daily.   cabergoline  (DOSTINEX ) 0.5 MG tablet Take 1 tablet (0.5 mg total) by mouth 3 (three) times a week.   dorzolamide -timolol  (COSOPT ) 2-0.5 % ophthalmic solution INSTILL 1 DROP INTO BOTH EYES TWICE A DAY   latanoprost  (XALATAN ) 0.005 % ophthalmic solution INSTILL 1 DROP INTO BOTH EYES AT BEDTIME   rosuvastatin  (CRESTOR ) 10 MG tablet Take 1 tablet (10 mg total) by mouth daily.   sildenafil  (REVATIO ) 20 MG tablet Take 1 tablet (20 mg total) by mouth daily as needed.   tadalafil  (CIALIS ) 5 MG tablet Take 2 tablets (10 mg total) by mouth daily as needed.   Testosterone  (ANDROGEL ) 20.25 MG/1.25GM (1.62%) GEL Use 1 pump on each shoulder daily in the morning.   Testosterone  20.25 MG/ACT (1.62%) GEL Apply 2 gel packets to the skin daily   tirzepatide  (MOUNJARO ) 10 MG/0.5ML Pen Inject 10 mg into the skin once a week.   No facility-administered encounter medications on file as of 08/15/2024.    Surgical History: Past Surgical History:  Procedure Laterality Date   APPENDECTOMY     CHOLECYSTECTOMY     COLONOSCOPY WITH PROPOFOL  N/A 07/23/2023   Procedure: COLONOSCOPY WITH PROPOFOL ;  Surgeon: Maryruth Ole DASEN, MD;  Location: [facility] END[facility]COPY;  Service: Endoscopy;   Laterality: N/A;  PATIENT SAID HE DOES NOT NEED AN INTERPRETER   EXTRACORPOREAL SHOCK WAVE LITHOTRIPSY Right 01/15/2022   Procedure: EXTRACORPOREAL SHOCK WAVE LITHOTRIPSY (ESWL);  Surgeon: Twylla Glendia BROCKS, MD;  Location: [facility] ORS;  Service: Urology;  Laterality: Right;   POLYPECTOMY  07/23/2023   Procedure: POLYPECTOMY;  Surgeon: Maryruth Ole DASEN, MD;  Location: [facility] ENDOSCOPY;  Service: Endoscopy;;    Medical History: Past Medical History:  Diagnosis Date   Anxiety    Diabetes mellitus without complication (HCC)    Glaucoma    Hyperlipidemia    Hypertension    IBS (irritable bowel syndrome)    Sleep apnea    Sleep apnea     Family History: Family History  Problem Relation Age of Onset   Hypertension Mother    Cancer Maternal Grandfather     Social History   Socioeconomic History   Marital status: Married    Spouse name: Not on file   Number of children: Not on file   Years of education: Not on file   Highest education level: Not on file  Occupational History   Not on file  Tobacco Use   Smoking status: Never    Passive exposure: Never   Smokeless tobacco: Never  Vaping Use   Vaping status: Never Used  Substance and Sexual Activity   Alcohol  use: Yes    Comment: social    Drug use: Never   Sexual  activity: Yes  Other Topics Concern   Not on file  Social History Narrative   Not on file   Social Drivers of Health   Financial Resource Strain: Not on file  Food Insecurity: Not on file  Transportation Needs: Not on file  Physical Activity: Not on file  Stress: Not on file  Social Connections: Not on file  Intimate Partner Violence: Not on file      Review of Systems  Vital Signs: BP 136/84   Pulse 74   Temp (!) 95 F (35 C)   Resp 16   Ht 5' 6 (1.676 m)   Wt 219 lb (99.3 kg)   SpO2 96%   BMI 35.35 kg/m    Physical Exam     Assessment/Plan:   General Counseling: Andre Savage verbalizes understanding of the findings of todays visit and  agrees with plan of treatment. I have discussed any further diagnostic evaluation that may be needed or ordered today. We also reviewed his medications today. he has been encouraged to call the office with any questions or concerns that should arise related to todays visit.    No orders of the defined types were placed in this encounter.   No orders of the defined types were placed in this encounter.   No follow-ups on file.   Total time spent:*** Minutes Time spent includes review of chart, medications, test results, and follow up plan with the patient.   [place] Controlled Substance Database was reviewed by me.  This patient was seen by Mardy Maxin, FNP-C in collaboration with Dr. Sigrid Bathe as a part of collaborative care agreement.   Sarahann Horrell R. Maxin, MSN, FNP-C Internal medicine

## 2024-08-16 ENCOUNTER — Encounter: Payer: Self-pay | Admitting: Nurse Practitioner

## 2024-09-19 ENCOUNTER — Other Ambulatory Visit

## 2024-09-26 ENCOUNTER — Ambulatory Visit: Admitting: Endocrinology

## 2024-09-28 ENCOUNTER — Other Ambulatory Visit: Payer: Self-pay | Admitting: Nurse Practitioner

## 2024-09-28 DIAGNOSIS — Z86018 Personal history of other benign neoplasm: Secondary | ICD-10-CM

## 2024-12-04 ENCOUNTER — Encounter: Admitting: Nurse Practitioner
